# Patient Record
Sex: Female | Born: 1980 | Race: White | Hispanic: No | Marital: Married | Smoking: Never smoker
Health system: Southern US, Community
[De-identification: ages and names within clinical notes are randomized; demographics above are authoritative.]

## PROBLEM LIST (undated history)

## (undated) ENCOUNTER — Inpatient Hospital Stay (HOSPITAL_COMMUNITY): Payer: Self-pay

## (undated) DIAGNOSIS — O24419 Gestational diabetes mellitus in pregnancy, unspecified control: Secondary | ICD-10-CM

## (undated) DIAGNOSIS — IMO0002 Reserved for concepts with insufficient information to code with codable children: Secondary | ICD-10-CM

## (undated) DIAGNOSIS — L409 Psoriasis, unspecified: Secondary | ICD-10-CM

## (undated) DIAGNOSIS — F419 Anxiety disorder, unspecified: Secondary | ICD-10-CM

## (undated) HISTORY — DX: Gestational diabetes mellitus in pregnancy, unspecified control: O24.419

## (undated) HISTORY — DX: Reserved for concepts with insufficient information to code with codable children: IMO0002

## (undated) HISTORY — PX: MOUTH SURGERY: SHX715

## (undated) HISTORY — DX: Anxiety disorder, unspecified: F41.9

## (undated) HISTORY — DX: Psoriasis, unspecified: L40.9

---

## 2010-04-29 ENCOUNTER — Other Ambulatory Visit: Payer: Self-pay | Admitting: Family

## 2010-04-29 ENCOUNTER — Other Ambulatory Visit (HOSPITAL_COMMUNITY)
Admission: RE | Admit: 2010-04-29 | Discharge: 2010-04-29 | Disposition: A | Discharge status: Home / Self Care | Payer: Medicaid Other | Source: Ambulatory Visit | Attending: Family | Admitting: Family

## 2010-04-29 DIAGNOSIS — Z3400 Encounter for supervision of normal first pregnancy, unspecified trimester: Secondary | ICD-10-CM

## 2010-04-29 DIAGNOSIS — Z113 Encounter for screening for infections with a predominantly sexual mode of transmission: Secondary | ICD-10-CM | POA: Insufficient documentation

## 2010-04-29 DIAGNOSIS — Z3480 Encounter for supervision of other normal pregnancy, unspecified trimester: Secondary | ICD-10-CM | POA: Insufficient documentation

## 2010-05-30 DIAGNOSIS — Z3400 Encounter for supervision of normal first pregnancy, unspecified trimester: Secondary | ICD-10-CM

## 2010-06-06 LAB — HIV ANTIBODY (ROUTINE TESTING W REFLEX): HIV: NONREACTIVE

## 2010-06-06 LAB — RPR: RPR: NONREACTIVE

## 2010-06-27 ENCOUNTER — Encounter (INDEPENDENT_AMBULATORY_CARE_PROVIDER_SITE_OTHER): Payer: Medicaid Other

## 2010-06-27 DIAGNOSIS — Z3400 Encounter for supervision of normal first pregnancy, unspecified trimester: Secondary | ICD-10-CM

## 2010-06-28 ENCOUNTER — Other Ambulatory Visit: Payer: Self-pay | Admitting: Obstetrics & Gynecology

## 2010-06-28 DIAGNOSIS — Z3689 Encounter for other specified antenatal screening: Secondary | ICD-10-CM

## 2010-07-04 ENCOUNTER — Encounter (INDEPENDENT_AMBULATORY_CARE_PROVIDER_SITE_OTHER): Payer: Medicaid Other

## 2010-07-04 DIAGNOSIS — R1084 Generalized abdominal pain: Secondary | ICD-10-CM

## 2010-07-04 DIAGNOSIS — F411 Generalized anxiety disorder: Secondary | ICD-10-CM

## 2010-07-04 DIAGNOSIS — Z3480 Encounter for supervision of other normal pregnancy, unspecified trimester: Secondary | ICD-10-CM

## 2010-07-12 ENCOUNTER — Ambulatory Visit (HOSPITAL_COMMUNITY)
Admission: RE | Admit: 2010-07-12 | Discharge: 2010-07-12 | Disposition: A | Discharge status: Home / Self Care | Payer: Medicaid Other | Source: Ambulatory Visit | Attending: Obstetrics & Gynecology | Admitting: Obstetrics & Gynecology

## 2010-07-12 ENCOUNTER — Other Ambulatory Visit: Payer: Self-pay | Admitting: Obstetrics & Gynecology

## 2010-07-12 DIAGNOSIS — Z363 Encounter for antenatal screening for malformations: Secondary | ICD-10-CM | POA: Insufficient documentation

## 2010-07-12 DIAGNOSIS — O358XX0 Maternal care for other (suspected) fetal abnormality and damage, not applicable or unspecified: Secondary | ICD-10-CM | POA: Insufficient documentation

## 2010-07-12 DIAGNOSIS — O2690 Pregnancy related conditions, unspecified, unspecified trimester: Secondary | ICD-10-CM

## 2010-07-12 DIAGNOSIS — Z3689 Encounter for other specified antenatal screening: Secondary | ICD-10-CM

## 2010-07-12 DIAGNOSIS — Z1389 Encounter for screening for other disorder: Secondary | ICD-10-CM | POA: Insufficient documentation

## 2010-07-13 ENCOUNTER — Encounter: Payer: Medicaid Other | Attending: Obstetrics & Gynecology

## 2010-07-13 ENCOUNTER — Ambulatory Visit (HOSPITAL_COMMUNITY)
Admission: RE | Admit: 2010-07-13 | Discharge: 2010-07-13 | Disposition: A | Discharge status: Home / Self Care | Payer: Medicaid Other | Source: Ambulatory Visit | Attending: Obstetrics & Gynecology | Admitting: Obstetrics & Gynecology

## 2010-07-13 ENCOUNTER — Other Ambulatory Visit: Payer: Self-pay | Admitting: Obstetrics & Gynecology

## 2010-07-13 DIAGNOSIS — O2690 Pregnancy related conditions, unspecified, unspecified trimester: Secondary | ICD-10-CM

## 2010-07-13 DIAGNOSIS — Z3689 Encounter for other specified antenatal screening: Secondary | ICD-10-CM | POA: Insufficient documentation

## 2010-07-13 DIAGNOSIS — O99810 Abnormal glucose complicating pregnancy: Secondary | ICD-10-CM | POA: Insufficient documentation

## 2010-07-13 DIAGNOSIS — O358XX0 Maternal care for other (suspected) fetal abnormality and damage, not applicable or unspecified: Secondary | ICD-10-CM | POA: Insufficient documentation

## 2010-07-13 DIAGNOSIS — Z713 Dietary counseling and surveillance: Secondary | ICD-10-CM | POA: Insufficient documentation

## 2010-07-25 ENCOUNTER — Encounter (INDEPENDENT_AMBULATORY_CARE_PROVIDER_SITE_OTHER): Payer: Medicaid Other

## 2010-07-25 DIAGNOSIS — Z3400 Encounter for supervision of normal first pregnancy, unspecified trimester: Secondary | ICD-10-CM

## 2010-07-28 ENCOUNTER — Ambulatory Visit (INDEPENDENT_AMBULATORY_CARE_PROVIDER_SITE_OTHER): Payer: Medicaid Other | Admitting: Psychology

## 2010-07-28 ENCOUNTER — Ambulatory Visit (HOSPITAL_COMMUNITY): Payer: Medicaid Other | Admitting: Psychology

## 2010-07-28 DIAGNOSIS — F411 Generalized anxiety disorder: Secondary | ICD-10-CM

## 2010-08-08 ENCOUNTER — Encounter (HOSPITAL_COMMUNITY): Payer: Medicaid Other | Admitting: Psychology

## 2010-08-10 ENCOUNTER — Other Ambulatory Visit: Payer: Self-pay | Admitting: Obstetrics & Gynecology

## 2010-08-10 ENCOUNTER — Ambulatory Visit (HOSPITAL_COMMUNITY)
Admission: RE | Admit: 2010-08-10 | Discharge: 2010-08-10 | Disposition: A | Discharge status: Home / Self Care | Payer: Medicaid Other | Source: Ambulatory Visit | Attending: Obstetrics & Gynecology | Admitting: Obstetrics & Gynecology

## 2010-08-10 DIAGNOSIS — O358XX0 Maternal care for other (suspected) fetal abnormality and damage, not applicable or unspecified: Secondary | ICD-10-CM | POA: Insufficient documentation

## 2010-08-10 DIAGNOSIS — O2690 Pregnancy related conditions, unspecified, unspecified trimester: Secondary | ICD-10-CM

## 2010-08-15 ENCOUNTER — Encounter (INDEPENDENT_AMBULATORY_CARE_PROVIDER_SITE_OTHER): Payer: Medicaid Other

## 2010-08-15 DIAGNOSIS — Z3480 Encounter for supervision of other normal pregnancy, unspecified trimester: Secondary | ICD-10-CM

## 2010-09-07 ENCOUNTER — Ambulatory Visit (HOSPITAL_COMMUNITY)
Admission: RE | Admit: 2010-09-07 | Discharge: 2010-09-07 | Disposition: A | Discharge status: Home / Self Care | Payer: Medicaid Other | Source: Ambulatory Visit | Attending: Obstetrics & Gynecology | Admitting: Obstetrics & Gynecology

## 2010-09-07 ENCOUNTER — Encounter (HOSPITAL_COMMUNITY): Payer: Self-pay

## 2010-09-07 VITALS — BP 102/60 | HR 93 | Wt 193.0 lb

## 2010-09-07 DIAGNOSIS — O2690 Pregnancy related conditions, unspecified, unspecified trimester: Secondary | ICD-10-CM

## 2010-09-07 DIAGNOSIS — Z3689 Encounter for other specified antenatal screening: Secondary | ICD-10-CM | POA: Insufficient documentation

## 2010-09-07 NOTE — Progress Notes (Signed)
Report in AS/EPIC; follow-up 6 weeks for growth

## 2010-09-12 ENCOUNTER — Encounter (INDEPENDENT_AMBULATORY_CARE_PROVIDER_SITE_OTHER): Payer: Medicaid Other

## 2010-09-12 DIAGNOSIS — Z3400 Encounter for supervision of normal first pregnancy, unspecified trimester: Secondary | ICD-10-CM

## 2010-09-15 ENCOUNTER — Other Ambulatory Visit: Payer: Self-pay | Admitting: Advanced Practice Midwife

## 2010-09-15 ENCOUNTER — Other Ambulatory Visit: Payer: Self-pay | Admitting: Obstetrics & Gynecology

## 2010-09-15 ENCOUNTER — Other Ambulatory Visit (INDEPENDENT_AMBULATORY_CARE_PROVIDER_SITE_OTHER): Payer: Medicaid Other

## 2010-09-15 ENCOUNTER — Telehealth: Payer: Self-pay | Admitting: *Deleted

## 2010-09-15 DIAGNOSIS — O3660X0 Maternal care for excessive fetal growth, unspecified trimester, not applicable or unspecified: Secondary | ICD-10-CM

## 2010-09-15 DIAGNOSIS — O361190 Maternal care for Anti-A sensitization, unspecified trimester, not applicable or unspecified: Secondary | ICD-10-CM

## 2010-09-15 DIAGNOSIS — Z3480 Encounter for supervision of other normal pregnancy, unspecified trimester: Secondary | ICD-10-CM

## 2010-09-15 NOTE — Telephone Encounter (Signed)
Scheduled for AFI 09/29/10 at 10:00 am at [facility].  Pt notified.

## 2010-09-15 NOTE — Telephone Encounter (Signed)
Message copied by Granville Lewis on Thu Sep 15, 2010  1:37 PM ------      Message from: Lesly Dukes      Created: Thu Sep 15, 2010  1:16 PM       Please have this patient seen for Rpt AFI in 2 weeks.  Pts GCT needs to be followed up.

## 2010-09-16 LAB — RPR

## 2010-09-16 LAB — CBC
HCT: 34.2 % — ABNORMAL LOW (ref 36.0–46.0)
Hemoglobin: 10.2 g/dL — ABNORMAL LOW (ref 12.0–15.0)
RDW: 15.9 % — ABNORMAL HIGH (ref 11.5–15.5)
WBC: 8.4 10*3/uL (ref 4.0–10.5)

## 2010-09-16 LAB — HIV ANTIBODY (ROUTINE TESTING W REFLEX): HIV: NONREACTIVE

## 2010-09-16 LAB — RH TYPE: Rh Type: NEGATIVE

## 2010-09-26 ENCOUNTER — Other Ambulatory Visit: Payer: Self-pay | Admitting: Advanced Practice Midwife

## 2010-09-26 ENCOUNTER — Encounter (INDEPENDENT_AMBULATORY_CARE_PROVIDER_SITE_OTHER): Payer: Medicaid Other | Admitting: Advanced Practice Midwife

## 2010-09-26 DIAGNOSIS — Z3480 Encounter for supervision of other normal pregnancy, unspecified trimester: Secondary | ICD-10-CM

## 2010-09-29 ENCOUNTER — Other Ambulatory Visit: Payer: Self-pay | Admitting: Obstetrics & Gynecology

## 2010-09-29 ENCOUNTER — Ambulatory Visit (HOSPITAL_COMMUNITY)
Admission: RE | Admit: 2010-09-29 | Discharge: 2010-09-29 | Disposition: A | Discharge status: Home / Self Care | Payer: Medicaid Other | Source: Ambulatory Visit | Attending: Obstetrics & Gynecology | Admitting: Obstetrics & Gynecology

## 2010-09-29 DIAGNOSIS — O3660X0 Maternal care for excessive fetal growth, unspecified trimester, not applicable or unspecified: Secondary | ICD-10-CM

## 2010-09-29 DIAGNOSIS — O2690 Pregnancy related conditions, unspecified, unspecified trimester: Secondary | ICD-10-CM

## 2010-09-29 DIAGNOSIS — O99810 Abnormal glucose complicating pregnancy: Secondary | ICD-10-CM | POA: Insufficient documentation

## 2010-09-29 DIAGNOSIS — Z3689 Encounter for other specified antenatal screening: Secondary | ICD-10-CM | POA: Insufficient documentation

## 2010-09-29 NOTE — Progress Notes (Signed)
Vital signs reviewed; see ultrasound report 

## 2010-10-03 ENCOUNTER — Encounter (INDEPENDENT_AMBULATORY_CARE_PROVIDER_SITE_OTHER): Payer: Medicaid Other

## 2010-10-03 DIAGNOSIS — Z3480 Encounter for supervision of other normal pregnancy, unspecified trimester: Secondary | ICD-10-CM

## 2010-10-03 DIAGNOSIS — O99810 Abnormal glucose complicating pregnancy: Secondary | ICD-10-CM

## 2010-10-06 ENCOUNTER — Ambulatory Visit (HOSPITAL_COMMUNITY): Admission: RE | Admit: 2010-10-06 | Payer: Medicaid Other | Source: Ambulatory Visit

## 2010-10-06 ENCOUNTER — Other Ambulatory Visit: Payer: Self-pay | Admitting: Obstetrics & Gynecology

## 2010-10-06 ENCOUNTER — Ambulatory Visit (HOSPITAL_COMMUNITY)
Admission: RE | Admit: 2010-10-06 | Discharge: 2010-10-06 | Disposition: A | Discharge status: Home / Self Care | Payer: Medicaid Other | Source: Ambulatory Visit | Attending: Obstetrics & Gynecology | Admitting: Obstetrics & Gynecology

## 2010-10-06 DIAGNOSIS — O4700 False labor before 37 completed weeks of gestation, unspecified trimester: Secondary | ICD-10-CM

## 2010-10-06 DIAGNOSIS — O99810 Abnormal glucose complicating pregnancy: Secondary | ICD-10-CM | POA: Insufficient documentation

## 2010-10-10 ENCOUNTER — Encounter (INDEPENDENT_AMBULATORY_CARE_PROVIDER_SITE_OTHER): Payer: Medicaid Other | Admitting: Physician Assistant

## 2010-10-10 DIAGNOSIS — Z3480 Encounter for supervision of other normal pregnancy, unspecified trimester: Secondary | ICD-10-CM

## 2010-10-10 DIAGNOSIS — F411 Generalized anxiety disorder: Secondary | ICD-10-CM

## 2010-10-10 DIAGNOSIS — O409XX0 Polyhydramnios, unspecified trimester, not applicable or unspecified: Secondary | ICD-10-CM

## 2010-10-13 ENCOUNTER — Ambulatory Visit (HOSPITAL_COMMUNITY)
Admission: RE | Admit: 2010-10-13 | Discharge: 2010-10-13 | Disposition: A | Discharge status: Home / Self Care | Payer: Medicaid Other | Source: Ambulatory Visit | Attending: Obstetrics & Gynecology | Admitting: Obstetrics & Gynecology

## 2010-10-13 DIAGNOSIS — O4700 False labor before 37 completed weeks of gestation, unspecified trimester: Secondary | ICD-10-CM

## 2010-10-13 DIAGNOSIS — O99810 Abnormal glucose complicating pregnancy: Secondary | ICD-10-CM

## 2010-10-13 NOTE — ED Notes (Signed)
FBS 140s this am.  Patient is having increased pressure, contractions this am.  Is scheduled for [place] today as well.  Will discuss with Dr. Sherrie George when reviewing the NST.

## 2010-10-13 NOTE — Progress Notes (Signed)
Vital signs reviewed; see ultrasound report 

## 2010-10-18 ENCOUNTER — Ambulatory Visit (HOSPITAL_COMMUNITY)
Admission: RE | Admit: 2010-10-18 | Discharge: 2010-10-18 | Disposition: A | Discharge status: Home / Self Care | Payer: Medicaid Other | Source: Ambulatory Visit | Attending: Obstetrics & Gynecology | Admitting: Obstetrics & Gynecology

## 2010-10-18 ENCOUNTER — Encounter: Payer: Medicaid Other | Admitting: Obstetrics & Gynecology

## 2010-10-18 ENCOUNTER — Other Ambulatory Visit: Payer: Self-pay | Admitting: Obstetrics & Gynecology

## 2010-10-18 DIAGNOSIS — O288 Other abnormal findings on antenatal screening of mother: Secondary | ICD-10-CM

## 2010-10-18 DIAGNOSIS — O99810 Abnormal glucose complicating pregnancy: Secondary | ICD-10-CM | POA: Insufficient documentation

## 2010-10-19 ENCOUNTER — Ambulatory Visit (HOSPITAL_COMMUNITY): Payer: Medicaid Other

## 2010-10-20 ENCOUNTER — Ambulatory Visit (HOSPITAL_COMMUNITY)
Admission: RE | Admit: 2010-10-20 | Discharge: 2010-10-20 | Disposition: A | Discharge status: Home / Self Care | Payer: Medicaid Other | Source: Ambulatory Visit | Attending: Obstetrics & Gynecology | Admitting: Obstetrics & Gynecology

## 2010-10-20 DIAGNOSIS — O4700 False labor before 37 completed weeks of gestation, unspecified trimester: Secondary | ICD-10-CM

## 2010-10-20 DIAGNOSIS — O99810 Abnormal glucose complicating pregnancy: Secondary | ICD-10-CM

## 2010-10-20 DIAGNOSIS — Z3689 Encounter for other specified antenatal screening: Secondary | ICD-10-CM | POA: Insufficient documentation

## 2010-10-20 NOTE — Progress Notes (Signed)
Ultrasound in AS/OBGYN/EPIC.  Follow up U/S scheduled NST reactive

## 2010-10-24 ENCOUNTER — Ambulatory Visit (INDEPENDENT_AMBULATORY_CARE_PROVIDER_SITE_OTHER): Payer: Medicaid Other | Admitting: Advanced Practice Midwife

## 2010-10-24 DIAGNOSIS — O99810 Abnormal glucose complicating pregnancy: Secondary | ICD-10-CM

## 2010-10-24 DIAGNOSIS — O409XX0 Polyhydramnios, unspecified trimester, not applicable or unspecified: Secondary | ICD-10-CM

## 2010-10-24 DIAGNOSIS — Z3480 Encounter for supervision of other normal pregnancy, unspecified trimester: Secondary | ICD-10-CM

## 2010-10-24 NOTE — Progress Notes (Signed)
Lots of lower pelvic pain and discomfort.

## 2010-10-27 ENCOUNTER — Other Ambulatory Visit (HOSPITAL_COMMUNITY): Payer: Medicaid Other

## 2010-10-27 ENCOUNTER — Ambulatory Visit (HOSPITAL_COMMUNITY): Payer: Medicaid Other

## 2010-10-28 ENCOUNTER — Ambulatory Visit (HOSPITAL_COMMUNITY): Payer: Medicaid Other

## 2010-10-28 ENCOUNTER — Ambulatory Visit (HOSPITAL_COMMUNITY)
Admission: RE | Admit: 2010-10-28 | Discharge: 2010-10-28 | Disposition: A | Discharge status: Home / Self Care | Payer: Medicaid Other | Source: Ambulatory Visit | Attending: Obstetrics & Gynecology | Admitting: Obstetrics & Gynecology

## 2010-10-28 ENCOUNTER — Other Ambulatory Visit: Payer: Self-pay | Admitting: Obstetrics & Gynecology

## 2010-10-28 ENCOUNTER — Other Ambulatory Visit (HOSPITAL_COMMUNITY): Payer: Self-pay | Admitting: Obstetrics and Gynecology

## 2010-10-28 DIAGNOSIS — O99810 Abnormal glucose complicating pregnancy: Secondary | ICD-10-CM

## 2010-10-28 DIAGNOSIS — O2690 Pregnancy related conditions, unspecified, unspecified trimester: Secondary | ICD-10-CM

## 2010-10-28 DIAGNOSIS — O24419 Gestational diabetes mellitus in pregnancy, unspecified control: Secondary | ICD-10-CM

## 2010-10-28 DIAGNOSIS — O4700 False labor before 37 completed weeks of gestation, unspecified trimester: Secondary | ICD-10-CM

## 2010-10-28 DIAGNOSIS — Z3689 Encounter for other specified antenatal screening: Secondary | ICD-10-CM | POA: Insufficient documentation

## 2010-10-28 NOTE — Progress Notes (Signed)
Encounter addended by: Marlana Latus, RN on: 10/28/2010 12:41 PM<BR>     Documentation filed: Charges VN, Flowsheet VN

## 2010-10-28 NOTE — Progress Notes (Signed)
Patient seen for ultrasound and NST appointment today.  Please see AS-OBGYN report for details.

## 2010-10-31 ENCOUNTER — Ambulatory Visit (INDEPENDENT_AMBULATORY_CARE_PROVIDER_SITE_OTHER): Payer: Medicaid Other | Admitting: Advanced Practice Midwife

## 2010-10-31 ENCOUNTER — Other Ambulatory Visit: Payer: Self-pay | Admitting: Advanced Practice Midwife

## 2010-10-31 VITALS — BP 120/79 | Temp 98.6°F | Ht 66.0 in | Wt 203.0 lb

## 2010-10-31 DIAGNOSIS — Z3480 Encounter for supervision of other normal pregnancy, unspecified trimester: Secondary | ICD-10-CM

## 2010-10-31 DIAGNOSIS — O99810 Abnormal glucose complicating pregnancy: Secondary | ICD-10-CM

## 2010-10-31 NOTE — Patient Instructions (Signed)
Pregnancy - Third Trimester The third trimester of pregnancy (the last 3 months) is a period of the most rapid growth for you and your baby. The baby approaches a length of 20 inches and a weight of 6 to 10 pounds. The baby is adding on fat and getting ready for life outside your body. While inside, babies have periods of sleeping and waking, suck their thumbs, and hiccups. You can often feel small contractions of the uterus. This is false labor. It is also called Braxton-Hicks contractions. This is like a practice for labor. The usual problems in this stage of pregnancy include more difficulty breathing, swelling of the hands and feet from water retention, and having to urinate more often because of the uterus and baby pressing on your bladder.  PRENATAL EXAMS  Blood work may continue to be done during prenatal exams. These tests are done to check on your health and the probable health of your baby. Blood work is used to follow your blood levels (hemoglobin). Anemia (low hemoglobin) is common during pregnancy. Iron and vitamins are given to help prevent this. You may also continue to be checked for diabetes. Some of the past blood tests may be done again.   The size of the uterus is measured during each visit. This makes sure your baby is growing properly according to your pregnancy dates.   Your blood pressure is checked every prenatal visit. This is to make sure you are not getting toxemia.   Your urine is checked every prenatal visit for infection, diabetes and protein.   Your weight is checked at each visit. This is done to make sure gains are happening at the suggested rate and that you and your baby are growing normally.   Sometimes, an ultrasound is performed to confirm the position and the proper growth and development of the baby. This is a test done that bounces harmless sound waves off the baby so your caregiver can more accurately determine due dates.   Discuss the type of pain  medication and anesthesia you will have during your labor and delivery.   Discuss the possibility and anesthesia if a Cesarean Section might be necessary.   Inform your caregiver if there is any mental or physical violence at home.  Sometimes, a specialized non-stress test, contraction stress test and biophysical profile are done to make sure the baby is not having a problem. Checking the amniotic fluid surrounding the baby is called an amniocentesis. The amniotic fluid is removed by sticking a needle into the belly (abdomen). This is sometimes done near the end of pregnancy if an early delivery is required. In this case, it is done to help make sure the baby's lungs are mature enough for the baby to live outside of the womb. If the lungs are not mature and it is unsafe to deliver the baby, an injection of cortisone medication is given to the mother 1 to 2 days before the delivery. This helps the baby's lungs mature and makes it safer to deliver the baby. CHANGES OCCURING IN THE THIRD TRIMESTER OF PREGNANCY Your body goes through many changes during pregnancy. They vary from person to person. Talk to your caregiver about changes you notice and are concerned about.  During the last trimester, you have probably had an increase in your appetite. It is normal to have cravings for certain foods. This varies from person to person and pregnancy to pregnancy.   You may begin to get stretch marks on your hips,   abdomen, and breasts. These are normal changes in the body during pregnancy. There are no exercises or medications to take which prevent this change.   Constipation may be treated with a stool softener or adding bulk to your diet. Drinking lots of fluids, fiber in vegetables, fruits, and whole grains are helpful.   Exercising is also helpful. If you have been very active up until your pregnancy, most of these activities can be continued during your pregnancy. If you have been less active, it is helpful  to start an exercise program such as walking. Consult your caregiver before starting exercise programs.   Avoid all smoking, alcohol, un-prescribed drugs, herbs and "street drugs" during your pregnancy. These chemicals affect the formation and growth of the baby. Avoid chemicals throughout the pregnancy to ensure the delivery of a healthy infant.   Backache, varicose veins and hemorrhoids may develop or get worse.   You will tire more easily in the third trimester, which is normal.   The baby's movements may be stronger and more often.   You may become short of breath easily.   Your belly button may stick out.   A yellow discharge may leak from your breasts called colostrum.   You may have a bloody mucus discharge. This usually occurs a few days to a week before labor begins.  HOME CARE INSTRUCTIONS  Keep your caregiver's appointments. Follow your caregiver's instructions regarding medication use, exercise, and diet.   During pregnancy, you are providing food for you and your baby. Continue to eat regular, well-balanced meals. Choose foods such as meat, fish, milk and other low fat dairy products, vegetables, fruits, and whole-grain breads and cereals. Your caregiver will tell you of the ideal weight gain.   A physical sexual relationship may be continued throughout pregnancy if there are no other problems such as early (premature) leaking of amniotic fluid from the membranes, vaginal bleeding, or belly (abdominal) pain.   Exercise regularly if there are no restrictions. Check with your caregiver if you are unsure of the safety of your exercises. Greater weight gain will occur in the last 2 trimesters of pregnancy. Exercising helps:   Control your weight.   Get you in shape for labor and delivery.   You lose weight after you deliver.   Rest a lot with legs elevated, or as needed for leg cramps or low back pain.   Wear a good support or jogging bra for breast tenderness during  pregnancy. This may help if worn during sleep. Pads or tissues may be used in the bra if you are leaking colostrum.   Do not use hot tubs, steam rooms, or saunas.   Wear your seat belt when driving. This protects you and your baby if you are in an accident.   Avoid raw meat, cat litter boxes and soil used by cats. These carry germs that can cause birth defects in the baby.   It is easier to loose urine during pregnancy. Tightening up and strengthening the pelvic muscles will help with this problem. You can practice stopping your urination while you are going to the bathroom. These are the same muscles you need to strengthen. It is also the muscles you would use if you were trying to stop from passing gas. You can practice tightening these muscles up 10 times a set and repeating this about 3 times per day. Once you know what muscles to tighten up, do not perform these exercises during urination. It is more likely to   cause an infection by backing up the urine.   Ask for help if you have financial, counseling or nutritional needs during pregnancy. Your caregiver will be able to offer counseling for these needs as well as refer you for other special needs.   Make a list of emergency phone numbers and have them available.   Plan on getting help from family or friends when you go home from the hospital.   Make a trial run to the hospital.   Take prenatal classes with the father to understand, practice and ask questions about the labor and delivery.   Prepare the baby's room/nursery.   Do not travel out of the city unless it is absolutely necessary and with the advice of your caregiver.   Wear only low or no heal shoes to have better balance and prevent falling.  MEDICATIONS AND DRUG USE IN PREGNANCY  Take prenatal vitamins as directed. The vitamin should contain 1 milligram of folic acid. Keep all vitamins out of reach of children. Only a couple vitamins or tablets containing iron may be fatal  to a baby or young child when ingested.   Avoid use of all medications, including herbs, over-the-counter medications, not prescribed or suggested by your caregiver. Only take over-the-counter or prescription medicines for pain, discomfort, or fever as directed by your caregiver. Do not use aspirin, ibuprofen (Motrin, Advil, Nuprin) or naproxen (Aleve) unless OK'd by your caregiver.   Let your caregiver also know about herbs you may be using.   Alcohol is related to a number of birth defects. This includes fetal alcohol syndrome. All alcohol, in any form, should be avoided completely. Smoking will cause low birth rate and premature babies.   Street/illegal drugs are very harmful to the baby. They are absolutely forbidden. A baby born to an addicted mother will be addicted at birth. The baby will go through the same withdrawal an adult does.  SEEK MEDICAL CARE IF  You have any concerns or worries during your pregnancy. It is better to call with your questions if you feel they cannot wait, rather than worry about them.  DECISIONS ABOUT CIRCUMCISION You may or may not know the sex of your baby. If you know your baby is a boy, it may be time to think about circumcision. Circumcision is the removal of the foreskin of the penis. This is the skin that covers the sensitive end of the penis. There is no proven medical need for this. Often this decision is made on what is popular at the time or based upon religious beliefs and social issues. You can discuss these issues with your caregiver or pediatrician. SEEK IMMEDIATE MEDICAL CARE IF:  An unexplained oral temperature above 101.0 develops, or as your caregiver suggests.   You have leaking of fluid from the vagina (birth canal). If leaking membranes are suspected, take your temperature and tell your caregiver of this when you call.   There is vaginal spotting, bleeding or passing clots. Tell your caregiver of the amount and how many pads are used.    You develop a bad smelling vaginal discharge with a change in the color from clear to white.   You develop vomiting that lasts more than 24 hours.   You develop chills or fever.   You develop shortness of breath.   You develop burning on urination.   You loose more than 2 pounds of weight or gain more than 2 pounds of weight or as suggested by your caregiver.     You notice sudden swelling of your face, hands, and feet or legs.   You develop belly (abdominal) pain. Round ligament discomfort is a common non-cancerous (benign) cause of abdominal pain in pregnancy. Your caregiver still must evaluate you.   You develop a severe headache that does not go away.   You develop visual problems, blurred or double vision.   If you have not felt your baby move for more than 1 hour. If you think the baby is not moving as much as usual, eat something with sugar in it and lie down on your left side for an hour. The baby should move at least 4 to 5 times per hour. Call right away if your baby moves less than that.   You fall, are in a car accident or any kind of trauma.   There is mental or physical violence at home.  Document Released: 01/24/2001 Document Re-Released: 11/27/2008 [employer] Patient Information 2011 [employer]. 

## 2010-10-31 NOTE — Progress Notes (Signed)
Reactive NST. GC/CT and GBS done today. SVE: 1/thick/high. U/S on 9/14 at [facility]: AFI 36, EFW 7# (89%ile), AC 97%ile, footling breech at that time. Cephalic presentation today on u/s. Has appt with [facility]FM for BPP and AFI on Thursday. Rev'd precautions.

## 2010-10-31 NOTE — Progress Notes (Signed)
p=106 

## 2010-10-31 NOTE — Progress Notes (Signed)
Addended by: Granville Lewis on: 10/31/2010 05:01 PM   Modules accepted: Orders

## 2010-11-02 LAB — GC/CHLAMYDIA PROBE AMP, GENITAL: Chlamydia, DNA Probe: NEGATIVE

## 2010-11-03 ENCOUNTER — Ambulatory Visit (HOSPITAL_COMMUNITY): Payer: Medicaid Other

## 2010-11-03 ENCOUNTER — Encounter: Payer: Self-pay | Admitting: *Deleted

## 2010-11-03 ENCOUNTER — Ambulatory Visit (HOSPITAL_COMMUNITY)
Admission: RE | Admit: 2010-11-03 | Discharge: 2010-11-03 | Disposition: A | Discharge status: Home / Self Care | Payer: Medicaid Other | Source: Ambulatory Visit | Attending: Obstetrics & Gynecology | Admitting: Obstetrics & Gynecology

## 2010-11-03 DIAGNOSIS — O409XX0 Polyhydramnios, unspecified trimester, not applicable or unspecified: Secondary | ICD-10-CM | POA: Insufficient documentation

## 2010-11-03 DIAGNOSIS — O99810 Abnormal glucose complicating pregnancy: Secondary | ICD-10-CM | POA: Insufficient documentation

## 2010-11-03 DIAGNOSIS — O2690 Pregnancy related conditions, unspecified, unspecified trimester: Secondary | ICD-10-CM

## 2010-11-03 DIAGNOSIS — O4700 False labor before 37 completed weeks of gestation, unspecified trimester: Secondary | ICD-10-CM

## 2010-11-03 NOTE — Progress Notes (Signed)
Encounter addended by: Marlana Latus, RN on: 11/03/2010  2:15 PM<BR>     Documentation filed: Episodes

## 2010-11-04 ENCOUNTER — Other Ambulatory Visit: Payer: Self-pay | Admitting: Obstetrics & Gynecology

## 2010-11-04 DIAGNOSIS — O409XX0 Polyhydramnios, unspecified trimester, not applicable or unspecified: Secondary | ICD-10-CM

## 2010-11-04 DIAGNOSIS — O99810 Abnormal glucose complicating pregnancy: Secondary | ICD-10-CM

## 2010-11-07 ENCOUNTER — Ambulatory Visit (INDEPENDENT_AMBULATORY_CARE_PROVIDER_SITE_OTHER): Payer: Medicaid Other | Admitting: Physician Assistant

## 2010-11-07 DIAGNOSIS — Z3480 Encounter for supervision of other normal pregnancy, unspecified trimester: Secondary | ICD-10-CM

## 2010-11-07 DIAGNOSIS — O99810 Abnormal glucose complicating pregnancy: Secondary | ICD-10-CM

## 2010-11-07 DIAGNOSIS — O409XX0 Polyhydramnios, unspecified trimester, not applicable or unspecified: Secondary | ICD-10-CM

## 2010-11-07 NOTE — Progress Notes (Signed)
p-93 

## 2010-11-07 NOTE — Progress Notes (Signed)
IOL scheduled 11/22/10.

## 2010-11-10 ENCOUNTER — Ambulatory Visit (HOSPITAL_COMMUNITY)
Admission: RE | Admit: 2010-11-10 | Discharge: 2010-11-10 | Disposition: A | Discharge status: Home / Self Care | Payer: Medicaid Other | Source: Ambulatory Visit | Attending: Obstetrics & Gynecology | Admitting: Obstetrics & Gynecology

## 2010-11-10 DIAGNOSIS — O99810 Abnormal glucose complicating pregnancy: Secondary | ICD-10-CM | POA: Insufficient documentation

## 2010-11-10 DIAGNOSIS — O409XX0 Polyhydramnios, unspecified trimester, not applicable or unspecified: Secondary | ICD-10-CM | POA: Insufficient documentation

## 2010-11-10 NOTE — Progress Notes (Signed)
Patient seen for ultrasound appointment today.  Please see AS-OBGYN report for details.  

## 2010-11-14 ENCOUNTER — Ambulatory Visit (INDEPENDENT_AMBULATORY_CARE_PROVIDER_SITE_OTHER): Payer: Medicaid Other | Admitting: Physician Assistant

## 2010-11-14 VITALS — BP 117/75 | Temp 98.5°F | Wt 208.0 lb

## 2010-11-14 DIAGNOSIS — O99810 Abnormal glucose complicating pregnancy: Secondary | ICD-10-CM

## 2010-11-14 DIAGNOSIS — Z3480 Encounter for supervision of other normal pregnancy, unspecified trimester: Secondary | ICD-10-CM

## 2010-11-14 DIAGNOSIS — O24419 Gestational diabetes mellitus in pregnancy, unspecified control: Secondary | ICD-10-CM

## 2010-11-14 MED ORDER — GLYBURIDE 2.5 MG PO TABS: 2.5000 mg | ORAL_TABLET | Freq: Every day | ORAL | Status: DC

## 2010-11-14 NOTE — Patient Instructions (Signed)
Natural Childbirth Natural childbirth is going through labor and delivery without any pain medication or having anesthesia (epidural or spinal). You also do not use fetal monitors, get a Cesarean Section (an incision in your lower stomach) or episiotomy (a cut on the outside of the lower vagina). With the help of a birthing professional (midwife), you will direct your own labor and delivery as you choose. Many women chose natural childbirth because they feel more in control and in touch with their labor and delivery. They are also concerned about the medications affecting themselves and the baby. Pregnant women with a high risk pregnancy should not attempt natural childbirth. This is because of the risks to themselves and the baby. It is better to deliver their baby in a hospital if an emergency situation arises. The caregiver has to intervene for the health and safety of the mother and baby. Pain during labor is the result of the cervix dilating and the uterus contracting to push the baby out through the vagina. Pain is also caused by stress, anxiety and the muscles of your body being tense, unrelaxed and out of shape. TWO TECHNIQUES FOR NATURAL CHILDBIRTH:   The Lamaze method teaches women that having a baby is normal, healthy and natural. It also teaches the mother to take a neutral position regarding pain medication and anesthesia and to make an informed decision if and when it is right for them.   The Erven Colla (also called Husband Coached Birth) teaches the father to be the birth coach and stresses a natural approach. It also encourages exercise and a balanced diet with good nutrition. The exercises teach relaxation and deep breathing techniques. However, there are also classes to prepare the parents for an emergency situation that may occur.  METHODS OF DEALING WITH LABOR PAIN AND DELIVERY:  Meditation.  Yoga.   Hypnosis.   Acupuncture.   Massage.   Changing positions (walking,  rocking, showering, leaning on birth balls).  Lying in warm water or a jacuzzi.   Get yourself some type of an activity that keeps your mind off of the labor pain.  Listen to soft music.   Visual imagery (focus on a particular object).   BEFORE GOING INTO LABOR  Be sure you and your spouse/partner are in agreement to have natural childbirth.   Decide if your caregiver or a midwife will deliver your baby.   Decide if you will have your baby in the hospital, birthing center or at home.   If you have children, make plans to have someone to take care of them when you go to the hospital.   Know the distance and the time it takes to go to the delivery center. Make a dry run to be sure.   Have a bag packed with a night gown, bathrobe and toiletries ready to take when you go into labor.   Keep phone numbers of your family and friends handy if you need to call someone when you go into labor.   Your spouse/partner should go to all the teaching classes.   Talk with your caregiver the possibility of medical emergency and what will happen if that occurs.  ADVANTAGES OF NATURAL CHILDBIRTH  You are in control of your labor and delivery.   It is safe.   There are no medications or anesthetics that may affect you and the baby.   There are no invasive procedures such as an episiotomy.   You and your partner will work together and that increases  your bond.   Meditation, yoga, massage and breathing exercise can be learned while pregnant and help you when you are in labor and at delivery.   In most delivery centers, the family and friends can be involved in the labor and delivery process.  DISADVANTAGES OF NATURAL CHILDBIRTH  You will experience pain during your labor and delivery.   The methods (stated above) of helping relieve your labor pains may not work for you.   You may feel embarrassed, disappointed and a failure if you decide to change your mind during labor and not have natural  childbirth.  AFTER THE DELIVERY  You will be very tired.   You will be uncomfortable because of your uterus contracting. You will feel soreness around the vagina.   You may feel cold and shaky, this is a natural reaction.   You will be excited, overwhelmed, accomplished and proud to be a mother.  HOME CARE INSTRUCTIONS  Follow the advice and instructions of your caregiver.   Follow the instructions of your natural childbirth instructor (Lamaze or Bradley Method).  Document Released: 01/13/2008  [facility] Patient Information 2011 [place], [place].Gestational Diabetes Mellitus (GDM) Gestational diabetes mellitus (GDM) is diabetes that occurs only during pregnancy. This happens when the body cannot properly handle the glucose (sugar) that increases in the blood after eating. During pregnancy, insulin resistance (reduced sensitivity to insulin) occurs because of the release of hormones from the placenta. Usually, the pancreas of pregnant women produces enough insulin to overcome the resistance that occurs. However, in gestational diabetes, the insulin is there but it does not work effectively. If the resistance is severe enough that the pancreas does not produce enough insulin, extra glucose builds up in the blood.  WHO IS AT RISK FOR DEVELOPING GESTATIONAL DIABETES?  Women with a history of diabetes in the family.   Women over age 41.   Women who are overweight.   Women in certain ethnic groups (Hispanic, African American, Native American, [place] and [place] Islander).  WHAT CAN HAPPEN TO THE BABY? If the mother's blood glucose is too high while she is pregnant, the extra sugar will travel through the umbilical cord to the baby. Some of the problems the baby may have are:  Large Baby - If the baby receives too much sugar, the baby will gain more weight. This may cause the baby to be too large to be born normally (vaginally) and a Cesarean section (C-section) may be needed.   Low Blood  Glucose (hypoglycemia) - The baby makes extra insulin, in response to the extra sugar its gets from its mother. When the baby is born and no longer needs this extra insulin, the baby's blood glucose level may drop.   Jaundice (yellow coloring of the skin and eyes) - This is fairly common in babies. It is caused from a build-up of the chemical called bilirubin. This is rarely serious, but is seen more often in babies whose mothers had gestational diabetes.  RISKS TO THE MOTHER Women who have had gestational diabetes may be at higher risk for some problems, including:  Preeclampsia or toxemia, which includes problems with high blood pressure. Blood pressure and protein levels in the urine must be checked frequently.   Infections.   Cesarean section (C-section) for delivery.   Developing Type 2 diabetes later in life. About 30-50% will develop diabetes later, especially if obese.  DIAGNOSIS The hormones that cause insulin resistance are highest at about 24-28 weeks of pregnancy. If symptoms are experienced, they  are much like symptoms you would normally expect during pregnancy.  GDM is often diagnosed using a two part method: 1. After 24-28 weeks of pregnancy, the woman drinks a glucose solution and takes a blood test. If the glucose level is high, a second test will be given.  2. Oral Glucose Tolerance Test (OGTT) which is 3 hours long - After not eating overnight, the blood glucose is checked. The woman drinks a glucose solution, and hourly blood glucose tests are taken.  If the woman has risk factors for GDM, the caregiver may test earlier than 24 weeks of pregnancy. TREATMENT Treatment of GDM is directed at keeping the mother's blood glucose level normal, and may include:  Meal planning.   Taking insulin or other medicine to control your blood glucose level.   Exercise.   Keeping a daily record of the foods you eat.   Blood glucose monitoring and keeping a record of your blood glucose  levels.   May monitor ketone levels in the urine, although this is no longer considered necessary in most pregnancies.  HOME CARE INSTRUCTIONS While you are pregnant:  Follow your caregiver's advice regarding your prenatal appointments, meal planning, exercise, medicines, vitamins, blood and other tests, and physical activities.   Keep a record of your meals, blood glucose tests, and the amount of insulin you are taking (if any). Show this to your caregiver at every prenatal visit.   If you have GDM, you may have problems with hypoglycemia (low blood glucose). You may suspect this if you become suddenly dizzy, feel shaky, and/or weak. If you think this is happening and you have a glucose meter, try to test your blood glucose level. Follow your caregiver's advice for when and how to treat your low blood glucose. Generally, the 15:15 rule is followed: Treat by consuming 15 grams of carbohydrates, wait 15 minutes, and recheck blood glucose. Examples of 15 grams of carbohydrates are:   1 cup skim or low fat milk.    cup juice.   3-4 glucose tablets.   5-6 hard candies.   1 small box raisins.    cup regular soda pop.   Practice good hygiene, to avoid infections.   Do not smoke.  SEEK MEDICAL CARE IF:  You develop abnormal vaginal discharge, with or without itching.   You become weak and tired more than expected.   You seem to sweat a lot.   You have a sudden increase in weight, 5 pounds or more in one week.   You are losing weight, 3 pounds or more in a week.   Your blood glucose level is high, and you need instructions on what to do about it.  SEEK IMMEDIATE MEDICAL CARE IF:  You develop a severe headache.   You faint or pass out.   You develop nausea and vomiting.   You become disoriented or confused.   You have a convulsion.   You develop vision problems.   You develop stomach pain.   You develop vaginal bleeding.   You develop uterine contractions.   You  have leaking or a gush of fluid from the vagina.  AFTER YOU HAVE THE BABY:  Go to all of your follow-up appointments, and have blood tests as advised by your caregiver.   Maintain a healthy lifestyle, to prevent diabetes in the future. This includes:   Following a healthy meal plan.   Controlling your weight.   Getting enough exercise and proper rest.   Do not smoke.  Breastfeed your baby if you can. This will lower the chance of you and your baby developing diabetes later in life.  For more information about diabetes, go to the American Diabetes Association at: PMFashions.com.cy. For more information about gestational diabetes, go to the [employer] at: RentRule.com.au. Document Released: 05/08/2000 Document Re-Released: 01/18/2009 [facility] Patient Information 2011 [place], [place].

## 2010-11-14 NOTE — Progress Notes (Signed)
p-88  Pt states that she had contractions lasting about 2 min x 1 hour on Sat night but when she laid down they were gone.

## 2010-11-14 NOTE — Progress Notes (Signed)
Not checking BS regularly, last check 2 weeks ago. States hard to check due to psorisis. ~ 1 hour pp today 137. Will check fasting with NST on Thursday. Cont glyburide as Rx'd. Strongly encouraged to chk BS as instructed 4 x daily.

## 2010-11-15 ENCOUNTER — Other Ambulatory Visit: Payer: Self-pay | Admitting: Advanced Practice Midwife

## 2010-11-17 ENCOUNTER — Ambulatory Visit (HOSPITAL_COMMUNITY)
Admission: RE | Admit: 2010-11-17 | Discharge: 2010-11-17 | Disposition: A | Discharge status: Home / Self Care | Payer: Medicaid Other | Source: Ambulatory Visit | Attending: Obstetrics & Gynecology | Admitting: Obstetrics & Gynecology

## 2010-11-17 DIAGNOSIS — O409XX0 Polyhydramnios, unspecified trimester, not applicable or unspecified: Secondary | ICD-10-CM

## 2010-11-17 DIAGNOSIS — O99810 Abnormal glucose complicating pregnancy: Secondary | ICD-10-CM

## 2010-11-18 ENCOUNTER — Telehealth (HOSPITAL_COMMUNITY): Payer: Self-pay | Admitting: *Deleted

## 2010-11-18 ENCOUNTER — Encounter (HOSPITAL_COMMUNITY): Payer: Self-pay | Admitting: *Deleted

## 2010-11-18 ENCOUNTER — Ambulatory Visit (INDEPENDENT_AMBULATORY_CARE_PROVIDER_SITE_OTHER): Payer: Medicaid Other | Admitting: Family

## 2010-11-18 DIAGNOSIS — O99810 Abnormal glucose complicating pregnancy: Secondary | ICD-10-CM

## 2010-11-18 DIAGNOSIS — Z3480 Encounter for supervision of other normal pregnancy, unspecified trimester: Secondary | ICD-10-CM

## 2010-11-18 DIAGNOSIS — O479 False labor, unspecified: Secondary | ICD-10-CM

## 2010-11-18 NOTE — Progress Notes (Signed)
Pt having contractions and lower back pain.  P-90  Pt feels like she may be having a slow amniotic leak.

## 2010-11-18 NOTE — Progress Notes (Signed)
Pt here with report of contractions that started around 1900 last night; the frequency has decreased today; also reports feeling slow leak of fluid; denies sexual intercourse.  Exam - negative pooling, negative ferns.  Reviewed labor precautions.  Next appt Mon with induction on Tuesday.

## 2010-11-18 NOTE — Telephone Encounter (Signed)
UPDATE RISK FACTORS

## 2010-11-18 NOTE — Telephone Encounter (Signed)
Preadmission screen  

## 2010-11-21 ENCOUNTER — Ambulatory Visit (INDEPENDENT_AMBULATORY_CARE_PROVIDER_SITE_OTHER): Payer: Medicaid Other | Admitting: Advanced Practice Midwife

## 2010-11-21 VITALS — BP 136/80 | Temp 97.4°F | Wt 211.0 lb

## 2010-11-21 DIAGNOSIS — O24419 Gestational diabetes mellitus in pregnancy, unspecified control: Secondary | ICD-10-CM

## 2010-11-21 DIAGNOSIS — O99810 Abnormal glucose complicating pregnancy: Secondary | ICD-10-CM

## 2010-11-21 DIAGNOSIS — Z3480 Encounter for supervision of other normal pregnancy, unspecified trimester: Secondary | ICD-10-CM

## 2010-11-21 NOTE — Progress Notes (Signed)
p-96  Pt thinks she may have lost mucus plug this weekend.

## 2010-11-21 NOTE — Progress Notes (Signed)
Reactive NST. Baby very active. For IOL in AM. Pt not checking CBGs due to psoriasis issues. CBG today 201. Lengthy discussion about needle phobia, C/S and anesthesia.

## 2010-11-21 NOTE — Patient Instructions (Signed)
Labor Induction A pregnant woman usually goes into labor spontaneously before the birth of her baby. Most babies are born between 57 and 42 weeks of the pregnancy. When this does not happen, caregivers may use medication or other methods to bring on (induce) labor. Labor induction causes a pregnant woman's uterus to contract, the cervix to open (dilate) and thin out (efface) to prepare for the vaginal birth of her baby. Several methods of labor induction may be used such as:  Massaging the nipple and areola of the breasts (nipple stimulation).   Prostaglandin medication used orally or as a vaginal cream.   Striping of membranes (your caregiver inserts a finger between the cervix and membranes around the baby's head) causes the body to produce prostaglandins that soften the cervix and cause the uterus to contract.   Rupture of the water bag (amniotomy).   Oxytocin by IV.   Special dilators placed into the cervical canal that causes the cervix to soften and open.   Mechanical devices to stretch open the cervix such as, a dilated foley catheter.  Whether your labor will be induced depends on the condition of you and your baby, how far along you are, are the baby's lung maturity, the condition of the cervix, the way the baby is lying, and other factors. Usually, labor is not induced before 39 weeks of the pregnancy unless there is a problem with the baby or mother, and it becomes necessary to induce labor. REASONS LABOR SHOULD BE INDUCED:  The health of the baby or mother has become at risk.   The pregnancy is overdue by 2 weeks or more.   Your water breaks (premature rupture of membranes), the baby's lungs are mature, and labor does not start on its own.   You develop high blood pressure (toxemia of pregnancy).   You develop an infection in your uterus.   You have diabetes or other serious medical illness.   Amniotic fluid amounts are small around the baby.   Your placenta begins to  separate from the inner wall of the uterus before the baby is born (placental abruption). This condition may cause you to have an emergency Cesarean delivery.   You have fetal death.   A social induction is also known as an induction for convenience. Most of the time, labor is induced for sound medical reasons. Sometimes, it is done as a Agricultural engineer. Living a long way from the hospital or having a history of very rapid labors may be reasons the mother may want to induce delivery.  REASONS LABOR SHOULD NOT BE INDUCED:  You have had previous surgeries on your uterus. This is especially true if the surgeries went into the inside lining and cavity of the uterus. This gives an added risk for rupturing the uterus.   You have placenta previa. This means your placenta lies very low in the uterus and blocks the opening (cervix) for the baby to get out.   Your baby is not in a head down position. For example, if your baby lies across your uterus (transverse) instead of head first.   If the umbilical cord drops down into the birth canal in front of your baby. This could cut off the baby's blood supply and oxygen to the baby.  RISKS & COMPLICATIONS OF THE PROCEDURE Problems seldom occur with labor induction, but there can be some complications. Some of the risks of induction include:  Change in fetal heart rate (too high, too low or irradic).  Increased risk of a premature baby, even if you think your baby is term.   Increased risk of fetal distress. This means your baby gets into problems during induction. This can be caused by the umbilical cord coming out in front of the baby or is being squeezed.   Increased risk of infection to mother and baby.   Increased chance of having a Cesarean delivery. This is an operation on your belly (abdomen) to remove the baby.   Strong contractions can lead to abruption. This is a separating of the placenta from the uterus.   Uterine rupture, especially if you  had a previous Cesarean or surgery on your uterus.  When labor is induced because of medical problems, other risks may be present. Induced labor may lead to:  Increased use of medications for pain relief.   Continuous fetal monitoring.   Other interventions.  When induction is needed for medical reasons, the benefits of induction may outweigh the risks. INDUCTION PROCEDURE It can sometimes take up to 2 or 3 days to induce labor. It usually takes less time. It takes longer when you are induced early in the pregnancy and for first pregnancies.  Before coming to the hospital for an induction:  Do not eat much before you come to the hospital (for at least 8 hours).   Do not eat after midnight if you are going to be induced the next morning.   Be aware that medications for labor induction can upset your stomach.   Let your caregiver know if you need medications for pain.  HOME CARE INSTRUCTIONS If you have been induced in your caregiver's office to start labor, and are allowed to go home, follow the instructions given to you by your care giver. SEEK IMMEDIATE MEDICAL CARE IF:  You develop any kind of vaginal bleeding.   You develop contractions that are severe and continuous.   You feel faint or feel light headed.   You do not develop contractions within the time your caregiver suggests you should.   You begin to run a temperature of 100 F (37.8 C) or develop chills.   You no longer feel the normal fetal movement.  Document Released: 06/21/2006 Document Re-Released: 11/26/2008 [facility] Patient Information 2011 [place], [place].

## 2010-11-22 ENCOUNTER — Inpatient Hospital Stay (HOSPITAL_COMMUNITY)
Admission: RE | Admit: 2010-11-22 | Discharge: 2010-11-26 | DRG: 765 | Disposition: A | Discharge status: Home / Self Care | Payer: Medicaid Other | Source: Ambulatory Visit | Attending: Obstetrics & Gynecology | Admitting: Obstetrics & Gynecology

## 2010-11-22 ENCOUNTER — Encounter (HOSPITAL_COMMUNITY): Payer: Self-pay

## 2010-11-22 DIAGNOSIS — O99810 Abnormal glucose complicating pregnancy: Secondary | ICD-10-CM

## 2010-11-22 DIAGNOSIS — O409XX0 Polyhydramnios, unspecified trimester, not applicable or unspecified: Secondary | ICD-10-CM

## 2010-11-22 DIAGNOSIS — O99814 Abnormal glucose complicating childbirth: Principal | ICD-10-CM | POA: Diagnosis present

## 2010-11-22 DIAGNOSIS — E860 Dehydration: Secondary | ICD-10-CM

## 2010-11-22 LAB — HEPATITIS B SURFACE ANTIGEN: Hepatitis B Surface Ag: NEGATIVE

## 2010-11-22 LAB — CBC
Hemoglobin: 10.8 g/dL — ABNORMAL LOW (ref 12.0–15.0)
MCH: 29.8 pg (ref 26.0–34.0)
MCHC: 32.1 g/dL (ref 30.0–36.0)
Platelets: 285 10*3/uL (ref 150–400)
RDW: 15.5 % (ref 11.5–15.5)

## 2010-11-22 LAB — RPR
RPR Ser Ql: NONREACTIVE
RPR: NONREACTIVE

## 2010-11-22 MED ORDER — MISOPROSTOL 25 MCG QUARTER TABLET
25.0000 ug | ORAL_TABLET | ORAL | Status: DC
  Administered 2010-11-22 (×2): 25 ug via VAGINAL
  Filled 2010-11-22 (×3): qty 1
  Filled 2010-11-22 (×3): qty 0.25
  Filled 2010-11-22: qty 1
  Filled 2010-11-22: qty 0.25

## 2010-11-22 MED ORDER — LIDOCAINE HCL (PF) 1 % IJ SOLN
INTRAMUSCULAR | Status: AC
  Filled 2010-11-22: qty 30

## 2010-11-22 MED ORDER — ACETAMINOPHEN 325 MG PO TABS
650.0000 mg | ORAL_TABLET | ORAL | Status: DC | PRN
  Administered 2010-11-22: 650 mg via ORAL
  Filled 2010-11-22 (×2): qty 1

## 2010-11-22 MED ORDER — OXYTOCIN BOLUS FROM INFUSION
500.0000 mL | Freq: Once | INTRAVENOUS | Status: DC
  Filled 2010-11-22: qty 500

## 2010-11-22 MED ORDER — GLYBURIDE 2.5 MG PO TABS
3.7500 mg | ORAL_TABLET | Freq: Every day | ORAL | Status: DC
  Administered 2010-11-22: 3.75 mg via ORAL
  Filled 2010-11-22 (×2): qty 1

## 2010-11-22 MED ORDER — LACTATED RINGERS IV SOLN
500.0000 mL | INTRAVENOUS | Status: DC | PRN
  Administered 2010-11-23: 500 mL via INTRAVENOUS

## 2010-11-22 MED ORDER — NALBUPHINE SYRINGE 5 MG/0.5 ML
10.0000 mg | INJECTION | INTRAMUSCULAR | Status: DC | PRN
  Administered 2010-11-23 (×2): 10 mg via INTRAVENOUS
  Filled 2010-11-22 (×2): qty 1

## 2010-11-22 MED ORDER — CITRIC ACID-SODIUM CITRATE 334-500 MG/5ML PO SOLN
30.0000 mL | ORAL | Status: DC | PRN
  Administered 2010-11-23: 30 mL via ORAL
  Filled 2010-11-22: qty 15

## 2010-11-22 MED ORDER — IBUPROFEN 600 MG PO TABS: 600.0000 mg | ORAL_TABLET | Freq: Four times a day (QID) | ORAL | Status: DC | PRN

## 2010-11-22 MED ORDER — ONDANSETRON HCL 4 MG/2ML IJ SOLN: 4.0000 mg | Freq: Four times a day (QID) | INTRAMUSCULAR | Status: DC | PRN

## 2010-11-22 MED ORDER — FLEET ENEMA 7-19 GM/118ML RE ENEM: 1.0000 | ENEMA | RECTAL | Status: DC | PRN

## 2010-11-22 MED ORDER — SERTRALINE HCL 100 MG PO TABS
100.0000 mg | ORAL_TABLET | Freq: Every day | ORAL | Status: DC
  Administered 2010-11-22: 100 mg via ORAL
  Filled 2010-11-22 (×2): qty 1

## 2010-11-22 MED ORDER — LIDOCAINE HCL (PF) 1 % IJ SOLN
30.0000 mL | INTRAMUSCULAR | Status: AC | PRN
  Administered 2010-11-22: 30 mL via SUBCUTANEOUS

## 2010-11-22 MED ORDER — OXYCODONE-ACETAMINOPHEN 5-325 MG PO TABS: 2.0000 | ORAL_TABLET | ORAL | Status: DC | PRN

## 2010-11-22 MED ORDER — LORAZEPAM 1 MG PO TABS
1.0000 mg | ORAL_TABLET | Freq: Once | ORAL | Status: AC
  Administered 2010-11-22: 1 mg via ORAL
  Filled 2010-11-22: qty 1

## 2010-11-22 MED ORDER — OXYTOCIN 20 UNITS IN LACTATED RINGERS INFUSION - SIMPLE: 125.0000 mL/h | Freq: Once | INTRAVENOUS | Status: DC

## 2010-11-22 MED ORDER — LACTATED RINGERS IV SOLN
INTRAVENOUS | Status: DC
  Administered 2010-11-23 (×4): via INTRAVENOUS

## 2010-11-22 NOTE — Progress Notes (Addendum)
Kathryn Peterson is a 30 y.o. G2P0010 at [redacted]w[redacted]d Subjective: Doing well; had episode of ctx that were more painful earlier but they have since decreased  Objective: BP 124/74  Pulse 69  Temp(Src) 98.3 F (36.8 C) (Oral)  Resp 18  Ht 5\' 6"  (1.676 m)  Wt 95.709 kg (211 lb)  BMI 34.06 kg/m2      FHT:  FHR: 140 bpm, variability: moderate,  accelerations:  Present,  decelerations:  Absent UC:   irregular, every 5+ minutes SVE:   Dilation: 2.5 Effacement (%): 70 Station: -2 Exam by:: Kathryn Peterson, CNM  Labs: Lab Results  Component Value Date   WBC 11.1* 11/22/2010   HGB 10.8* 11/22/2010   HCT 33.6* 11/22/2010   MCV 92.8 11/22/2010   PLT 285 11/22/2010    Assessment / Plan: IOL for GDM  2nd cytotec placed to continue with induction process  Kathryn Peterson 11/22/2010, 1700
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
## 2010-11-22 NOTE — H&P (Signed)
Chart reviewed and agree with management and plan.  

## 2010-11-23 ENCOUNTER — Encounter (HOSPITAL_COMMUNITY): Payer: Self-pay

## 2010-11-23 ENCOUNTER — Encounter (HOSPITAL_COMMUNITY): Payer: Self-pay | Admitting: Anesthesiology

## 2010-11-23 ENCOUNTER — Inpatient Hospital Stay (HOSPITAL_COMMUNITY): Payer: Medicaid Other | Admitting: Anesthesiology

## 2010-11-23 ENCOUNTER — Encounter (HOSPITAL_COMMUNITY)
Admission: RE | Disposition: A | Discharge status: Home / Self Care | Payer: Self-pay | Source: Ambulatory Visit | Attending: Obstetrics & Gynecology

## 2010-11-23 DIAGNOSIS — O409XX0 Polyhydramnios, unspecified trimester, not applicable or unspecified: Secondary | ICD-10-CM

## 2010-11-23 DIAGNOSIS — O99810 Abnormal glucose complicating pregnancy: Secondary | ICD-10-CM

## 2010-11-23 SURGERY — Surgical Case
Anesthesia: Monitor Anesthesia Care | Site: Abdomen | Wound class: Clean Contaminated

## 2010-11-23 MED ORDER — ZOLPIDEM TARTRATE 5 MG PO TABS: 5.0000 mg | ORAL_TABLET | Freq: Every evening | ORAL | Status: DC | PRN

## 2010-11-23 MED ORDER — PHENYLEPHRINE HCL 10 MG/ML IJ SOLN
INTRAMUSCULAR | Status: DC | PRN
  Administered 2010-11-23: 40 ug via INTRAVENOUS
  Administered 2010-11-23: 80 ug via INTRAVENOUS

## 2010-11-23 MED ORDER — DIPHENHYDRAMINE HCL 25 MG PO CAPS: 25.0000 mg | ORAL_CAPSULE | ORAL | Status: DC | PRN

## 2010-11-23 MED ORDER — SERTRALINE HCL 100 MG PO TABS
100.0000 mg | ORAL_TABLET | Freq: Every day | ORAL | Status: DC
  Administered 2010-11-23 – 2010-11-25 (×3): 100 mg via ORAL
  Filled 2010-11-23 (×3): qty 1

## 2010-11-23 MED ORDER — DIPHENHYDRAMINE HCL 50 MG/ML IJ SOLN: 12.5000 mg | INTRAMUSCULAR | Status: DC | PRN

## 2010-11-23 MED ORDER — KETOROLAC TROMETHAMINE 30 MG/ML IJ SOLN
30.0000 mg | Freq: Four times a day (QID) | INTRAMUSCULAR | Status: AC | PRN
  Administered 2010-11-23: 30 mg via INTRAVENOUS

## 2010-11-23 MED ORDER — LACTATED RINGERS IV SOLN
INTRAVENOUS | Status: DC
  Administered 2010-11-24: 03:00:00 via INTRAVENOUS

## 2010-11-23 MED ORDER — DIBUCAINE 1 % RE OINT: 1.0000 "application " | TOPICAL_OINTMENT | RECTAL | Status: DC | PRN

## 2010-11-23 MED ORDER — SODIUM CHLORIDE 0.9 % IJ SOLN: 3.0000 mL | INTRAMUSCULAR | Status: DC | PRN

## 2010-11-23 MED ORDER — DIPHENHYDRAMINE HCL 25 MG PO CAPS
25.0000 mg | ORAL_CAPSULE | Freq: Four times a day (QID) | ORAL | Status: DC | PRN
  Administered 2010-11-24: 25 mg via ORAL
  Filled 2010-11-23: qty 1

## 2010-11-23 MED ORDER — CEFAZOLIN SODIUM-DEXTROSE 1-4 GM-% IV SOLR
1.0000 g | Freq: Once | INTRAVENOUS | Status: DC
Start: 2010-11-23 — End: 2010-11-23

## 2010-11-23 MED ORDER — ONDANSETRON HCL 4 MG/2ML IJ SOLN
INTRAMUSCULAR | Status: AC
  Filled 2010-11-23: qty 2

## 2010-11-23 MED ORDER — MIDAZOLAM HCL 2 MG/2ML IJ SOLN
INTRAMUSCULAR | Status: AC
  Filled 2010-11-23: qty 4

## 2010-11-23 MED ORDER — NALBUPHINE HCL 10 MG/ML IJ SOLN: 5.0000 mg | INTRAMUSCULAR | Status: DC | PRN

## 2010-11-23 MED ORDER — SCOPOLAMINE 1 MG/3DAYS TD PT72
MEDICATED_PATCH | TRANSDERMAL | Status: AC
  Filled 2010-11-23: qty 1

## 2010-11-23 MED ORDER — NALOXONE HCL 0.4 MG/ML IJ SOLN: 0.4000 mg | INTRAMUSCULAR | Status: DC | PRN

## 2010-11-23 MED ORDER — ONDANSETRON HCL 4 MG/2ML IJ SOLN: 4.0000 mg | Freq: Three times a day (TID) | INTRAMUSCULAR | Status: DC | PRN

## 2010-11-23 MED ORDER — BUPIVACAINE HCL (PF) 0.5 % IJ SOLN
INTRAMUSCULAR | Status: DC | PRN
  Administered 2010-11-23: 30 mL

## 2010-11-23 MED ORDER — MEPERIDINE HCL 25 MG/ML IJ SOLN: 6.2500 mg | INTRAMUSCULAR | Status: DC | PRN

## 2010-11-23 MED ORDER — ONDANSETRON HCL 4 MG PO TABS: 4.0000 mg | ORAL_TABLET | ORAL | Status: DC | PRN

## 2010-11-23 MED ORDER — SIMETHICONE 80 MG PO CHEW: 80.0000 mg | CHEWABLE_TABLET | ORAL | Status: DC | PRN

## 2010-11-23 MED ORDER — OXYCODONE-ACETAMINOPHEN 5-325 MG PO TABS
1.0000 | ORAL_TABLET | ORAL | Status: DC | PRN
Start: 2010-11-23 — End: 2010-11-26
  Administered 2010-11-24 – 2010-11-26 (×8): 1 via ORAL
  Filled 2010-11-23 (×8): qty 1

## 2010-11-23 MED ORDER — TETANUS-DIPHTH-ACELL PERTUSSIS 5-2.5-18.5 LF-MCG/0.5 IM SUSP
0.5000 mL | Freq: Once | INTRAMUSCULAR | Status: AC
  Administered 2010-11-23: 0.5 mL via INTRAMUSCULAR
  Filled 2010-11-23: qty 0.5

## 2010-11-23 MED ORDER — MORPHINE SULFATE 0.5 MG/ML IJ SOLN
INTRAMUSCULAR | Status: AC
  Filled 2010-11-23: qty 10

## 2010-11-23 MED ORDER — PHENYLEPHRINE 40 MCG/ML (10ML) SYRINGE FOR IV PUSH (FOR BLOOD PRESSURE SUPPORT)
PREFILLED_SYRINGE | INTRAVENOUS | Status: AC
  Filled 2010-11-23: qty 15

## 2010-11-23 MED ORDER — KETOROLAC TROMETHAMINE 30 MG/ML IJ SOLN
INTRAMUSCULAR | Status: AC
Start: 2010-11-23 — End: 2010-11-24
  Filled 2010-11-23: qty 1

## 2010-11-23 MED ORDER — SIMETHICONE 80 MG PO CHEW
80.0000 mg | CHEWABLE_TABLET | Freq: Three times a day (TID) | ORAL | Status: DC
  Administered 2010-11-23 – 2010-11-26 (×10): 80 mg via ORAL

## 2010-11-23 MED ORDER — SODIUM CHLORIDE 0.9 % IV SOLN: 1.0000 ug/kg/h | INTRAVENOUS | Status: DC | PRN

## 2010-11-23 MED ORDER — MIDAZOLAM HCL 2 MG/2ML IJ SOLN
INTRAMUSCULAR | Status: AC
  Filled 2010-11-23: qty 2

## 2010-11-23 MED ORDER — TERBUTALINE SULFATE 1 MG/ML IJ SOLN: 0.2500 mg | Freq: Once | INTRAMUSCULAR | Status: DC

## 2010-11-23 MED ORDER — OXYTOCIN 20 UNITS IN LACTATED RINGERS INFUSION - SIMPLE
INTRAVENOUS | Status: DC | PRN
  Administered 2010-11-23: 20 [IU] via INTRAVENOUS

## 2010-11-23 MED ORDER — IBUPROFEN 600 MG PO TABS
600.0000 mg | ORAL_TABLET | Freq: Four times a day (QID) | ORAL | Status: DC
  Administered 2010-11-24 – 2010-11-26 (×10): 600 mg via ORAL
  Filled 2010-11-23 (×3): qty 1

## 2010-11-23 MED ORDER — WITCH HAZEL-GLYCERIN EX PADS: 1.0000 "application " | MEDICATED_PAD | CUTANEOUS | Status: DC | PRN

## 2010-11-23 MED ORDER — OXYTOCIN 20 UNITS IN LACTATED RINGERS INFUSION - SIMPLE
125.0000 mL/h | INTRAVENOUS | Status: AC
  Administered 2010-11-23: 125 mL/h via INTRAVENOUS

## 2010-11-23 MED ORDER — HYDROMORPHONE HCL 1 MG/ML IJ SOLN: 0.2500 mg | INTRAMUSCULAR | Status: DC | PRN

## 2010-11-23 MED ORDER — MENTHOL 3 MG MT LOZG: 1.0000 | LOZENGE | OROMUCOSAL | Status: DC | PRN

## 2010-11-23 MED ORDER — IBUPROFEN 600 MG PO TABS
600.0000 mg | ORAL_TABLET | Freq: Four times a day (QID) | ORAL | Status: DC | PRN
  Filled 2010-11-23 (×7): qty 1

## 2010-11-23 MED ORDER — KETOROLAC TROMETHAMINE 60 MG/2ML IM SOLN: 60.0000 mg | Freq: Once | INTRAMUSCULAR | Status: AC | PRN

## 2010-11-23 MED ORDER — MORPHINE SULFATE (PF) 0.5 MG/ML IJ SOLN
INTRAMUSCULAR | Status: DC | PRN
  Administered 2010-11-23: .1 mg via INTRATHECAL

## 2010-11-23 MED ORDER — PNEUMOCOCCAL VAC POLYVALENT 25 MCG/0.5ML IJ INJ: 0.5000 mL | INJECTION | Freq: Once | INTRAMUSCULAR | Status: DC

## 2010-11-23 MED ORDER — NALOXONE HCL 0.4 MG/ML IJ SOLN
INTRAMUSCULAR | Status: AC
  Filled 2010-11-23: qty 1

## 2010-11-23 MED ORDER — FENTANYL CITRATE 0.05 MG/ML IJ SOLN
INTRAMUSCULAR | Status: DC | PRN
  Administered 2010-11-23: 15 ug via INTRATHECAL

## 2010-11-23 MED ORDER — SERTRALINE HCL 100 MG PO TABS
100.0000 mg | ORAL_TABLET | Freq: Every day | ORAL | Status: DC
  Filled 2010-11-23: qty 1

## 2010-11-23 MED ORDER — INFLUENZA VIRUS VACC SPLIT PF IM SUSP
0.5000 mL | Freq: Once | INTRAMUSCULAR | Status: DC
  Filled 2010-11-23: qty 0.5

## 2010-11-23 MED ORDER — LANOLIN HYDROUS EX OINT: 1.0000 "application " | TOPICAL_OINTMENT | CUTANEOUS | Status: DC | PRN

## 2010-11-23 MED ORDER — SENNOSIDES-DOCUSATE SODIUM 8.6-50 MG PO TABS
2.0000 | ORAL_TABLET | Freq: Every day | ORAL | Status: DC
  Administered 2010-11-23 – 2010-11-25 (×3): 2 via ORAL

## 2010-11-23 MED ORDER — MIDAZOLAM HCL 5 MG/5ML IJ SOLN
INTRAMUSCULAR | Status: DC | PRN
  Administered 2010-11-23 (×4): 1 mg via INTRAVENOUS

## 2010-11-23 MED ORDER — EPHEDRINE 5 MG/ML INJ
INTRAVENOUS | Status: AC
  Filled 2010-11-23: qty 10

## 2010-11-23 MED ORDER — ONDANSETRON HCL 4 MG/2ML IJ SOLN: 4.0000 mg | INTRAMUSCULAR | Status: DC | PRN

## 2010-11-23 MED ORDER — OXYTOCIN 20 UNITS IN LACTATED RINGERS INFUSION - SIMPLE
1.0000 m[IU]/min | INTRAVENOUS | Status: DC
  Administered 2010-11-23: 2 m[IU]/min via INTRAVENOUS
  Filled 2010-11-23: qty 1000

## 2010-11-23 MED ORDER — FENTANYL CITRATE 0.05 MG/ML IJ SOLN
INTRAMUSCULAR | Status: AC
Start: 2010-11-23 — End: 2010-11-23
  Filled 2010-11-23: qty 2

## 2010-11-23 MED ORDER — KETOROLAC TROMETHAMINE 30 MG/ML IJ SOLN: 30.0000 mg | Freq: Four times a day (QID) | INTRAMUSCULAR | Status: AC | PRN

## 2010-11-23 MED ORDER — BUPIVACAINE IN DEXTROSE 0.75-8.25 % IT SOLN
INTRATHECAL | Status: DC | PRN
  Administered 2010-11-23: 1.5 mL via INTRATHECAL

## 2010-11-23 MED ORDER — CEFAZOLIN SODIUM 1-5 GM-% IV SOLN
1.0000 g | Freq: Once | INTRAVENOUS | Status: AC
  Administered 2010-11-23: 1 g via INTRAVENOUS
  Filled 2010-11-23: qty 50

## 2010-11-23 MED ORDER — DIPHENHYDRAMINE HCL 50 MG/ML IJ SOLN: 25.0000 mg | INTRAMUSCULAR | Status: DC | PRN

## 2010-11-23 MED ORDER — TERBUTALINE SULFATE 1 MG/ML IJ SOLN: 0.2500 mg | Freq: Once | INTRAMUSCULAR | Status: DC | PRN

## 2010-11-23 MED ORDER — PRENATAL PLUS 27-1 MG PO TABS
1.0000 | ORAL_TABLET | Freq: Every day | ORAL | Status: DC
  Administered 2010-11-25 – 2010-11-26 (×2): 1 via ORAL
  Filled 2010-11-23 (×2): qty 1

## 2010-11-23 MED ORDER — METOCLOPRAMIDE HCL 5 MG/ML IJ SOLN: 10.0000 mg | Freq: Three times a day (TID) | INTRAMUSCULAR | Status: DC | PRN

## 2010-11-23 MED ORDER — NALBUPHINE SYRINGE 5 MG/0.5 ML
10.0000 mg | INJECTION | INTRAMUSCULAR | Status: DC | PRN
  Administered 2010-11-23: 10 mg via INTRAVENOUS
  Filled 2010-11-23 (×2): qty 1

## 2010-11-23 MED ORDER — SCOPOLAMINE 1 MG/3DAYS TD PT72
1.0000 | MEDICATED_PATCH | Freq: Once | TRANSDERMAL | Status: DC
  Administered 2010-11-23: 1.5 mg via TRANSDERMAL

## 2010-11-23 MED ORDER — RHO D IMMUNE GLOBULIN 1500 UNIT/2ML IJ SOLN
300.0000 ug | Freq: Once | INTRAMUSCULAR | Status: DC
  Filled 2010-11-23: qty 2

## 2010-11-23 MED ORDER — OXYTOCIN 20 UNITS IN LACTATED RINGERS INFUSION - SIMPLE
INTRAVENOUS | Status: AC
  Filled 2010-11-23: qty 1000

## 2010-11-23 MED ORDER — OXYTOCIN 10 UNIT/ML IJ SOLN
INTRAMUSCULAR | Status: AC
  Filled 2010-11-23: qty 4

## 2010-11-23 SURGICAL SUPPLY — 34 items
BENZOIN TINCTURE PRP APPL 2/3 (GAUZE/BANDAGES/DRESSINGS) ×2 IMPLANT
CHLORAPREP W/TINT 26ML (MISCELLANEOUS) ×2 IMPLANT
CLOTH BEACON ORANGE TIMEOUT ST (SAFETY) ×2 IMPLANT
CONTAINER PREFILL 10% NBF 15ML (MISCELLANEOUS) IMPLANT
DRESSING TELFA 8X3 (GAUZE/BANDAGES/DRESSINGS) IMPLANT
DRSG COVADERM 4X6 (GAUZE/BANDAGES/DRESSINGS) ×2 IMPLANT
ELECT REM PT RETURN 9FT ADLT (ELECTROSURGICAL) ×2
ELECTRODE REM PT RTRN 9FT ADLT (ELECTROSURGICAL) ×1 IMPLANT
GAUZE SPONGE 4X4 12PLY STRL LF (GAUZE/BANDAGES/DRESSINGS) IMPLANT
GLOVE BIO SURGEON STRL SZ 6.5 (GLOVE) ×4 IMPLANT
GOWN PREVENTION PLUS LG XLONG (DISPOSABLE) ×6 IMPLANT
KIT ABG SYR 3ML LUER SLIP (SYRINGE) IMPLANT
NEEDLE HYPO 25X5/8 SAFETYGLIDE (NEEDLE) IMPLANT
NEEDLE SPNL 18GX3.5 QUINCKE PK (NEEDLE) ×2 IMPLANT
NS IRRIG 1000ML POUR BTL (IV SOLUTION) ×2 IMPLANT
PACK C SECTION WH (CUSTOM PROCEDURE TRAY) ×2 IMPLANT
PAD ABD 7.5X8 STRL (GAUZE/BANDAGES/DRESSINGS) IMPLANT
SLEEVE SCD COMPRESS KNEE MED (MISCELLANEOUS) IMPLANT
STRIP CLOSURE SKIN 1/2X4 (GAUZE/BANDAGES/DRESSINGS) ×2 IMPLANT
SUT PDS AB 0 CTX 60 (SUTURE) IMPLANT
SUT VIC AB 0 CT1 27 (SUTURE)
SUT VIC AB 0 CT1 27XBRD ANBCTR (SUTURE) IMPLANT
SUT VIC AB 0 CT1 36 (SUTURE) IMPLANT
SUT VIC AB 2-0 CT1 27 (SUTURE) ×3
SUT VIC AB 2-0 CT1 TAPERPNT 27 (SUTURE) ×3 IMPLANT
SUT VIC AB 2-0 CTX 36 (SUTURE) ×4 IMPLANT
SUT VIC AB 3-0 CT1 27 (SUTURE) ×1
SUT VIC AB 3-0 CT1 TAPERPNT 27 (SUTURE) ×1 IMPLANT
SUT VIC AB 3-0 SH 27 (SUTURE)
SUT VIC AB 3-0 SH 27X BRD (SUTURE) IMPLANT
SYR 30ML LL (SYRINGE) ×2 IMPLANT
TOWEL OR 17X24 6PK STRL BLUE (TOWEL DISPOSABLE) ×4 IMPLANT
TRAY FOLEY CATH 14FR (SET/KITS/TRAYS/PACK) IMPLANT
WATER STERILE IRR 1000ML POUR (IV SOLUTION) IMPLANT

## 2010-11-23 NOTE — Progress Notes (Signed)
Subjective: Doing fairly well.  Mild discomfort.  Objective: BP 126/88  Pulse 81  Temp(Src) 97.7 F (36.5 C) (Oral)  Resp 18  Ht 5\' 6"  (1.676 m)  Wt 95.709 kg (211 lb)  BMI 34.06 kg/m2  SpO2 98%      FHT:  FHR: 120s bpm, variability: moderate,  accelerations:  Present,  decelerations:  Present late Late decel 2-3 minutes after AROM, responding to repositioning.  Head well applied to cervix.  No cord prolapse.  UC:  regular, every 3 minutes SVE:   Dilation: 5.5 Effacement (%): 70 Station: -2;-3 Exam by:: Dr. Adrian Blackwater  Labs: Lab Results  Component Value Date   WBC 11.1* 11/22/2010   HGB 10.8* 11/22/2010   HCT 33.6* 11/22/2010   MCV 92.8 11/22/2010   PLT 285 11/22/2010    Assessment / Plan: IOL for poly.  AROM with large amount of clear fluid  Labor: continue pit Preeclampsia:  NA Fetal Wellbeing:  Category II Pain Control:  Nubain I/D:  n/a Anticipated MOD:  NSVD  Kathryn Peterson JEHIEL 11/23/2010, 12:47 PM

## 2010-11-23 NOTE — Transfer of Care (Signed)
Immediate Anesthesia Transfer of Care Note  Patient: Kathryn Peterson  Procedure(s) Performed:  CESAREAN SECTION  Patient Location: PACU  Anesthesia Type: Spinal  Level of Consciousness: awake, alert , oriented and patient cooperative  Airway & Oxygen Therapy: Patient Spontanous Breathing  Post-op Assessment: Report given to PACU RN and Post -op Vital signs reviewed and stable  Post vital signs: Reviewed and stable  Complications: No apparent anesthesia complications

## 2010-11-23 NOTE — Progress Notes (Signed)
Ok for pt to ambulate in hallway for , then resume monitoring & start pitocin at that time. Per K. Clelia Croft, CNM.

## 2010-11-23 NOTE — Progress Notes (Signed)
Elease Swarm is a 30 y.o. G2P0010 at [redacted]w[redacted]d  Subjective: Pt reports ctx have decreased in freq and intensity; desires to be 'unhooked' from monitors/IV and amb 30-52mins  Objective: BP 123/74  Pulse 94  Temp(Src) 98.1 F (36.7 C) (Oral)  Resp 20  Ht 5\' 6"  (1.676 m)  Wt 95.709 kg (211 lb)  BMI 34.06 kg/m2  SpO2 98%      FHT:  FHR: 130 bpm, variability: moderate,  accelerations:  Present,  decelerations:  Absent UC:   irregular, every 2-5 minutes SVE:   Dilation: 4 Effacement (%): 70 Station: -2 Exam by:: l.poore ,rn  Labs: Lab Results  Component Value Date   WBC 11.1* 11/22/2010   HGB 10.8* 11/22/2010   HCT 33.6* 11/22/2010   MCV 92.8 11/22/2010   PLT 285 11/22/2010    Assessment / Plan: IOL process Latent phase  Pt will amb and then when returns will start Pitocin  SHAW, KIMBERLY 11/23/2010, 1:42 AM
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
## 2010-11-23 NOTE — Progress Notes (Signed)
  Ms. Donelan ctxs are adequate as documented by [facility].  FHR is reassuring, variables noted. Her cervix is 6 1/2  And baby is at -2 station. By my exam EFW is 8 1/2 pounds, relatively narrow pelvis. 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  I have recommended a C/S.  Because of her needle phobia, I will give her rhogam, tdap, pneumovac and flu vaccine while she is under anesthesia.  All questions were answered. Dr. Claudius Sis came in to speak with the patient.

## 2010-11-23 NOTE — Progress Notes (Signed)
Pt back from walking. Monitors reapplied

## 2010-11-23 NOTE — Anesthesia Postprocedure Evaluation (Signed)
Anesthesia Post Note  Patient: Kathryn Peterson  Procedure(s) Performed:  CESAREAN SECTION  Anesthesia type: Spinal  Patient location: PACU  Post pain: Pain level controlled  Post assessment: Post-op Vital signs reviewed  Last Vitals:  Filed Vitals:   11/23/10 2115  BP: 115/63  Pulse: 83  Temp: 97.3 F (36.3 C)  Resp: 16    Post vital signs: Reviewed  Level of consciousness: awake  Complications: No apparent anesthesia complications

## 2010-11-23 NOTE — Progress Notes (Signed)
Kathryn Peterson is a 30 y.o. G2P0010 at [redacted]w[redacted]d   Subjective: Breathing through ctx; doing well; declines pain meds at present  Objective: BP 138/94  Pulse 80  Temp(Src) 98 F (36.7 C) (Oral)  Resp 20  Ht 5\' 6"  (1.676 m)  Wt 95.709 kg (211 lb)  BMI 34.06 kg/m2  SpO2 98%      FHT:  FHR: 130 bpm, variability: moderate,  accelerations:  Present,  decelerations:  Absent UC:   regular, every 2 minutes with Pit at 75mu/min SVE:   Dilation: 5 Effacement (%): 70 Station: -2;Ballotable Exam by:: l.poore, rn  Labs: Lab Results  Component Value Date   WBC 11.1* 11/22/2010   HGB 10.8* 11/22/2010   HCT 33.6* 11/22/2010   MCV 92.8 11/22/2010   PLT 285 11/22/2010    Assessment / Plan: Induction of labor due to gestational diabetes,  progressing well on pitocin  Re eval cx in 1-2 hours or sooner if needed  Cam Hai 11/23/2010, 6:54 AM

## 2010-11-23 NOTE — Progress Notes (Signed)
Subjective: Feeling contractions severely.  Nubain helpful.  Objective: BP 134/80  Pulse 97  Temp(Src) 98 F (36.7 C) (Oral)  Resp 18  Ht 5\' 6"  (1.676 m)  Wt 95.709 kg (211 lb)  BMI 34.06 kg/m2  SpO2 98%      FHT:  FHR: 120s bpm, variability: moderate,  accelerations:  Present,  decelerations:  Present earlies UC:   regular, every 2-3 minutes SVE:   Dilation: 5.5 Effacement (%): 90 Station: -2;-1 Exam by:: L.Lyons, RN  Labs: Lab Results  Component Value Date   WBC 11.1* 11/22/2010   HGB 10.8* 11/22/2010   HCT 33.6* 11/22/2010   MCV 92.8 11/22/2010   PLT 285 11/22/2010    Assessment / Plan: IOL for poly.  IUPC placed.  Continue to titrate pitocin  Fetal Wellbeing:  Category II Pain Control:  nubain I/D:  n/a Anticipated MOD:  NSVD  Kathryn Peterson JEHIEL 11/23/2010, 4:07 PM

## 2010-11-23 NOTE — Op Note (Signed)
[name] PROCEDURE DATE: 11/23/2010  PREOPERATIVE DIAGNOSIS: Intrauterine pregnancy at  [redacted]w[redacted]d weeks gestation; failure to progress: arrest of dilation  POSTOPERATIVE DIAGNOSIS: The same  PROCEDURE:     Primary Low Transverse Cesarean Section  SURGEON:  Dr. Nicholaus Bloom  ASSISTANT: Dr Candelaria Celeste  INDICATIONS: Kathryn Peterson is a 30 y.o. G2P1011 at [redacted]w[redacted]d scheduled for cesarean section secondary to failure to progress: arrest of dilation.  The risks of cesarean section discussed with the patient included but were not limited to: bleeding which may require transfusion or reoperation; infection which may require antibiotics; injury to bowel, bladder, ureters or other surrounding organs; injury to the fetus; need for additional procedures including hysterectomy in the event of a life-threatening hemorrhage; placental abnormalities wth subsequent pregnancies, incisional problems, thromboembolic phenomenon and other postoperative/anesthesia complications. The patient concurred with the proposed plan, giving informed written consent for the procedure.    FINDINGS:  Viable female/female infant in cephalic presentation.  Apgars 6 and 8, weight, 8 pounds and 6 ounces.  Clear amniotic fluid.  Intact placenta, three vessel cord.  Normal uterus, fallopian tubes and ovaries bilaterally.  ANESTHESIA:    Spinal INTRAVENOUS FLUIDS: 2500 ml ESTIMATED BLOOD LOSS: 600 ml URINE OUTPUT:  250 ml SPECIMENS: Placenta sent to L&D COMPLICATIONS: None immediate  PROCEDURE IN DETAIL:  The patient received intravenous antibiotics and had sequential compression devices applied to her lower extremities while in the preoperative area.  She was then taken to the operating room where spinal anesthesia was administered  and was found to be adequate. She was then placed in a dorsal supine position with a leftward tilt, and prepped and draped in a sterile manner.  A foley catheter was placed into her bladder and attached to  constant gravity. The bladder drained clear urine, which remained clear throughout the entire operation.  An adequate timeout was performed.  Prior to the incision, Marcaine 0.25% 30mL was injected into the skin.  A Pfannenstiel skin incision was then made with scalpel and carried through to the underlying layer of fascia. The fascia was incised in the midline and this incision was extended bilaterally using the Mayo scissors. A 1 cm midline incision was made through the rectus muscle on either side with cautery with hemostasis.  The peritoneum was then entered bluntly and a bladder blade was placed.  Attention was turned to the lower uterine segment where a transverse hysterotomy was made with a scalpel and extended bilaterally bluntly. The bladder blade was then removed. The infant was noted to be floating high in the uterus and not engaged in the pelvis.  The infant was successfully delivered and a nuchal cord x 1 was reduced.  The cord was clamped and cut and infant was handed over to awaiting neonatology team. Uterine massage was then administered and the placenta delivered intact with three-vessel cord. The uterus was then cleared of clot and debris.  The hysterotomy was closed with 2-0 CTX in a running locked fashion in a single layer closure.  Overall, hemostasis was noted. The fascia was then closed using 0 Vicryl in a running fashion.  The subcutaneous layer was irrigated and the skin was closed with 3-0 Vicryl.  The patient tolerated the procedure well. Sponge, lap, instrument and needle counts were correct x 2. She was taken to the recovery room in stable condition.    Candelaria Celeste JEHIEL DO 11/23/2010 6:58 PM

## 2010-11-23 NOTE — Anesthesia Procedure Notes (Addendum)
Spinal Block  Patient location during procedure: OR Start time: 11/23/2010 6:20 PM Staffing Performed by: anesthesiologist  Preanesthetic Checklist Completed: patient identified, site marked, surgical consent, pre-op evaluation, timeout performed, IV checked, risks and benefits discussed and monitors and equipment checked Spinal Block Patient position: sitting Prep: site prepped and draped and DuraPrep Patient monitoring: heart rate, cardiac monitor, continuous pulse ox and blood pressure Approach: midline Location: L3-4 Injection technique: single-shot Needle Needle type: Sprotte  Needle gauge: 24 G Needle length: 9 cm Assessment Sensory level: T4 Additional Notes Clear free flow CSF on 1st attempt.  Pt sedated with Versed at her request, but still awake and able to communicate.

## 2010-11-23 NOTE — Progress Notes (Signed)
Subjective: MOre comfortable after nubain.  Objective: BP 103/70  Pulse 88  Temp(Src) 97.6 F (36.4 C) (Oral)  Resp 18  Ht 5\' 6"  (1.676 m)  Wt 95.709 kg (211 lb)  BMI 34.06 kg/m2  SpO2 98%      FHT:  FHR: 130s bpm, variability: moderate,  accelerations:  Present,  decelerations:  Absent UC:   regular, every 2-3 minutes SVE:   Dilation: 5.5 Effacement (%): 70 Station: -2;-3 Exam by:: Dr. Adrian Blackwater  Labs: Lab Results  Component Value Date   WBC 11.1* 11/22/2010   HGB 10.8* 11/22/2010   HCT 33.6* 11/22/2010   MCV 92.8 11/22/2010   PLT 285 11/22/2010    Assessment / Plan: IOL for GDM, polyhydramnios\  Labor: pitocin. Preeclampsia:  NA Fetal Wellbeing:  Category I Pain Control:  nubain I/D:  n/a Anticipated MOD:  NSVD  Kathryn Peterson 11/23/2010, 9:52 AM

## 2010-11-23 NOTE — Progress Notes (Signed)
FHR indeterminate. Readjusting cardio.

## 2010-11-24 MED ORDER — LACTATED RINGERS IV SOLN: Freq: Once | INTRAVENOUS | Status: DC

## 2010-11-24 NOTE — Progress Notes (Signed)
Encounter addended by: Madison Hickman on: 11/24/2010  8:15 AM<BR>     Documentation filed: Notes Section

## 2010-11-24 NOTE — Progress Notes (Signed)
UR chart review completed.  

## 2010-11-24 NOTE — Progress Notes (Signed)

## 2010-11-24 NOTE — Anesthesia Postprocedure Evaluation (Signed)
  Anesthesia Post-op Note  Patient: Kathryn Peterson  Procedure(s) Performed:  CESAREAN SECTION  Patient Location: Mother/Baby  Anesthesia Type: Spinal  Level of Consciousness: awake, alert  and oriented  Airway and Oxygen Therapy: Patient Spontanous Breathing  Post-op Pain: none  Post-op Assessment: Post-op Vital signs reviewed, Patient's Cardiovascular Status Stable, No headache, No backache, No residual numbness and No residual motor weakness  Post-op Vital Signs: Reviewed and stable  Complications: No apparent anesthesia complications

## 2010-11-24 NOTE — Progress Notes (Signed)
Post Partum Day 1/POD #1; PLTCS for CPD Subjective: no complaints, up ad lib, voiding and tolerating PO, small lochia, plans to breastfeed, no method (pt counseled, still unsure if she is going to use [facility]).  PT's IV infiltrated last night, and she refused to have it replaced.  She has been pushing PO fluids  Objective: Blood pressure 147/84, pulse 102, temperature 98.5 F (36.9 C), temperature source Oral, resp. rate 20, height 5\' 6"  (1.676 m), weight 95.709 kg (211 lb), SpO2 97.00%, unknown if currently breastfeeding. (PT is currently breastfeeding)  Physical Exam:  General: alert, cooperative and no distress Lochia:normal flow Chest: CTAB Heart: RRR no m/r/g Abdomen: +BS, soft, nontender, Dsg dry and intact Uterine Fundus: firm @ U-2 DVT Evaluation: No evidence of DVT seen on physical exam. 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

## 2010-11-25 LAB — GLUCOSE, CAPILLARY: Glucose-Capillary: 109 mg/dL — ABNORMAL HIGH (ref 70–99)

## 2010-11-25 NOTE — Progress Notes (Signed)
Subjective: Postpartum Day 2: Cesarean Delivery for CPD.  Patient reports minimal epigastric pain; tolerating PO   Contraception method: undetermined      Objective: Vital signs in last 24 hours: Temp:  [97.9 F (36.6 C)-99.8 F (37.7 C)] 97.9 F (36.6 C) (10/12 0452) Pulse Rate:  [75-101] 75  (10/12 0452) Resp:  [18-20] 18  (10/12 0452) BP: (112-132)/(73-86) 117/74 mmHg (10/12 0452) SpO2:  [98 %-99 %] 98 % (10/11 1730)  I/O last 3 completed shifts: In: 7652.1 [P.O.:5205; I.V.:2447.1] Out: 4575 [Urine:4575]   CBG (last 3)   Basename 11/25/10 0619 11/23/10 2048  GLUCAP 109* 106*      Physical Exam:  General: alert, cooperative and no distress Lochia: appropriate Uterine Fundus: firm Incision: healing well, no significant drainage, no dehiscence DVT Evaluation: No evidence of DVT seen on physical exam.   Basename 11/22/10 1030  HGB 10.8*  HCT 33.6*    Assessment/Plan: Status post Cesarean section. Doing well postoperatively.  Discharge home with standard precautions and return to clinic in 4-6 weeks.  Clinton Sawyer, Demetries Coia 11/25/2010, 7:40 AM

## 2010-11-26 MED ORDER — IBUPROFEN 800 MG PO TABS: 800.0000 mg | ORAL_TABLET | Freq: Three times a day (TID) | ORAL | Status: AC

## 2010-11-26 MED ORDER — OXYCODONE-ACETAMINOPHEN 5-325 MG PO TABS: 2.0000 | ORAL_TABLET | ORAL | Status: AC | PRN

## 2010-11-26 NOTE — Discharge Summary (Signed)
Obstetric Discharge Summary Reason for Admission: induction of labor for Gestational Diabetes Prenatal Procedures: NST and ultrasound Intrapartum Procedures: cesarean: low cervical, transverse Postpartum Procedures: Rho(D) Ig Complications-Operative and Postpartum: none Hemoglobin  Date Value Range Status  11/22/2010 10.8* 12.0-15.0 (g/dL) Final     HCT  Date Value Range Status  11/22/2010 33.6* 36.0-46.0 (%) Final    Discharge Diagnoses: Term Pregnancy-delivered  Discharge Information: Date: 11/26/2010 Activity: pelvic rest Diet: routine Medications: PNV, Ibuprofen, Colace, Iron and Percocet Condition: stable Instructions: refer to practice specific booklet Discharge to: home Follow-up Information    Please follow up. (At [facility] as scheduled)          Newborn Data: Live born female  Birth Weight: 8 lb 6.4 oz (3810 g) APGAR: 6, 8  Home with mother.  Yoceline Bazar N 11/26/2010, 7:37 AM

## 2010-11-26 NOTE — Progress Notes (Signed)
Subjective: Postpartum Day 3: Cesarean Delivery, POD#3 Patient reports incisional pain, tolerating PO, + flatus, + BM and no problems voiding.  Breastfeeding. Already has her follow-up appointment at [facility].  Objective: Vital signs in last 24 hours: Temp:  [97.8 F (36.6 C)-98.3 F (36.8 C)] 98.3 F (36.8 C) (10/13 0625) Pulse Rate:  [69-96] 69  (10/13 0625) Resp:  [18-20] 20  (10/13 0625) BP: (107-114)/(68-73) 114/72 mmHg (10/13 1610)  Physical Exam:  General: alert, cooperative and no distress Heart: RRR, no murmurs Lungs: CTA B/L Lochia: appropriate Uterine Fundus: firm Incision: healing well DVT Evaluation: No evidence of DVT seen on physical exam.  No results found for this basename: HGB:2,HCT:2 in the last 72 hours  Assessment/Plan: Status post Cesarean section. Doing well postoperatively.  Discharge home with standard precautions and return to clinic in 4-6 weeks.  Naithen Rivenburg N 11/26/2010, 7:32 AM

## 2010-11-27 ENCOUNTER — Encounter (HOSPITAL_COMMUNITY): Payer: Self-pay | Admitting: Obstetrics & Gynecology

## 2010-11-29 ENCOUNTER — Inpatient Hospital Stay (HOSPITAL_COMMUNITY): Admission: AD | Admit: 2010-11-29 | Payer: Self-pay | Source: Ambulatory Visit | Admitting: Family Medicine

## 2010-12-16 ENCOUNTER — Encounter: Payer: Self-pay | Admitting: Advanced Practice Midwife

## 2010-12-16 ENCOUNTER — Ambulatory Visit (INDEPENDENT_AMBULATORY_CARE_PROVIDER_SITE_OTHER): Payer: Medicaid Other | Admitting: Advanced Practice Midwife

## 2010-12-16 DIAGNOSIS — Z09 Encounter for follow-up examination after completed treatment for conditions other than malignant neoplasm: Secondary | ICD-10-CM

## 2010-12-16 DIAGNOSIS — F411 Generalized anxiety disorder: Secondary | ICD-10-CM

## 2010-12-16 DIAGNOSIS — Z98891 History of uterine scar from previous surgery: Secondary | ICD-10-CM

## 2010-12-16 DIAGNOSIS — F419 Anxiety disorder, unspecified: Secondary | ICD-10-CM | POA: Insufficient documentation

## 2010-12-16 DIAGNOSIS — F40298 Other specified phobia: Secondary | ICD-10-CM

## 2010-12-16 NOTE — Progress Notes (Signed)
  Subjective:    Patient ID: Leonard Downing, female    DOB: 09-23-1980, 30 y.o.   MRN: [id number]  HPI Presents for incision check/post op check s/p C/S on 12/01/10.  Has done well post op with good bowel and bladder function, and decreasing pain. Has had some issues with breastfeeding > working with Peds on that.  No redness or drainage from incision.  Has been on Zoloft for her anxiety. Now has had some crying spells this week. Has counselor that she likes.  Review of Systems As above    Objective:   Physical Exam  Abdomen soft and appropriately tender.  Incision with steristrips clean and intact. Steristrips removed. There is a small area of excoriation in the center of the incision from the adhesive.      Assessment & Plan:  A:  PostOp X 2 weeks      Incision healing well. Steristrips removed.\     Return in 4 weeks     Pt agrees to call counselor today to help with coping with being a new mom and the stress in her life. Will not increase Zoloft at this time unless her counselor suggests it. Pt has good support from family and is agreeable.

## 2011-01-02 ENCOUNTER — Ambulatory Visit: Payer: Medicaid Other

## 2011-01-02 ENCOUNTER — Encounter: Payer: Self-pay | Admitting: Advanced Practice Midwife

## 2011-01-02 ENCOUNTER — Ambulatory Visit (INDEPENDENT_AMBULATORY_CARE_PROVIDER_SITE_OTHER): Payer: Medicaid Other | Admitting: Advanced Practice Midwife

## 2011-01-02 DIAGNOSIS — O99345 Other mental disorders complicating the puerperium: Secondary | ICD-10-CM

## 2011-01-02 DIAGNOSIS — IMO0002 Reserved for concepts with insufficient information to code with codable children: Secondary | ICD-10-CM

## 2011-01-02 NOTE — Progress Notes (Signed)
  Subjective:     Kathryn Peterson is a 30 y.o. female who presents for a postpartum visit. She is 6 weeks postpartum following a low cervical transverse Cesarean section. I have fully reviewed the prenatal and intrapartum course. The delivery was at 39.1 gestational weeks. Outcome: primary cesarean section, low transverse incision. Anesthesia: spinal. Postpartum course has been uncomplicated except for postpartum depression. Pt was experiencing increased depressive sx at last visit, has since visited her counselor and is feeling much better. Baby's course has been complicted by low weight gain and acid reflux. Baby is feeding by both breast and bottle - supplementing with formula d/t low weight gain/low production. Bleeding staining only. Bowel function is normal. Bladder function is normal. Patient is not sexually active.  Postpartum depression screening: positive. Pt states she is feeling emotionally well now, states she answered questions on depression screening for entire postpartum period, not current feelings, was having depression issues a few weeks ago. Pregnancy was complicated by GDM.     Review of Systems Pertinent items are noted in HPI.   Objective:    BP 108/72  Pulse 73  Temp(Src) 97.1 F (36.2 C) (Oral)  Resp 16  Ht 5\' 5"  (1.651 m)  Wt 80.74 kg (178 lb)  BMI 29.62 kg/m2  Breastfeeding? Yes  General:  alert, cooperative, no distress and moderately obese   Breasts:  inspection negative, no nipple discharge or bleeding, no masses or nodularity palpable  Abdomen: soft, non-tender; bowel sounds normal; no masses,  no organomegaly and c/s incision well healed        Assessment:    6 week postpartum exam. Pap smear not due at today's visit.   Plan:     1. Recommended 2 hour GTT d/t history of GDM with this pregnancy. Pt declines d/t severe needle phobia. Agrees to check fasting blood sugars at home. Rev'd increased risk of DM outside of pregnancy, pt states understanding.  2.  Follow up in: 1 year or as needed.

## 2011-01-02 NOTE — Patient Instructions (Signed)
Postpartum Depression After delivery, your body is going through a drastic change in hormone levels. You may find yourself crying for no apparent reason and unable to cope with all the changes a new baby brings. This is a common response following a pregnancy. Seek support from your partner and/or friends and just give yourself time to recover. If these feelings persist and you feel you are getting worse, contact your caregiver or other professionals who can help you. WHAT IS DEPRESSION? Depression can be described as feeling sad, blue, unhappy, miserable, or down in the dumps. Most of [place] feel this way at one time or another for short periods. But true clinical depression is a mood disorder in which feelings of sadness, loss, anger, fear, or frustration interfere with everyday life for an extended time. Depression can be mild, moderate, or severe. The degree of depression, which your caregiver can determine, influences your treatment. Postpartum depression occurs within a couple days to months after delivering your baby. HOW COMMON IS DEPRESSION DURING AND AFTER PREGNANCY? Depression that occurs during pregnancy or within a year after delivery is called perinatal depression. Depression after pregnancy is also called postpartum depression or peripartum depression. The exact number of women with depression during this time is unknown, but it occurs in between 10-15% of women. Researchers believe that depression is one of the most common complications during and after pregnancy. The depression is often not recognized or treated, because some normal pregnancy changes cause similar symptoms and are happening at the same time. Tiredness, problems sleeping, stronger emotional reactions, and changes in body weight may occur during and after pregnancy. But these symptoms may also be signs of depression.  CAUSES  Rapid hormone changes. Estrogen and progesterone usually decrease immediately after delivering your  baby. Researchers think the fast change in hormone levels may lead to depression, just as smaller changes in hormones can affect a woman's moods before she gets her menstrual period.   Decrease in thyroid hormone. Thyroid hormone regulates how your body uses and stores energy from food (metabolism). A simple blood test can tell if this condition is causing a woman's depression. If so, thyroid medicine can be prescribed by your caregiver.   A stressful life event, such as a death in the family. This can cause chemical changes in the brain that lead to depression.   Feeling overwhelmed by caring for and raising a new baby.   Depression is also an illness that runs in some families. It is not always clear what causes depression.  FACTORS THAT MAY INCREASE A WOMAN'S CHANCE OF DEPRESSION DURING PREGNANCY:  History of depression.   Substance abuse, alcohol, or drugs.   Little support from family and friends.   Problems with previous pregnancy or birth.   Young age for motherhood.   Living alone.   Little or no social support.   Family history of mental illness.   Anxiety about the fetus.   Marital or financial problems.   Postpartum depression in a previous pregnancy.   Having a psychiatric illness (schizophrenia, bipolar disorder).   Going through a difficult or stressful pregnancy.   Going through a difficult labor and delivery.   Moving to another city or state during your pregnancy, or just after delivering your baby.  OTHER FACTORS THAT MAY CONTRIBUTE TO POSTPARTUM DEPRESSION INCLUDE:   Feeling tired after delivery, broken sleep patterns, and not getting enough rest. This often keeps a new mother from regaining her full strength for weeks.   Feeling  overwhelmed with a new baby to take care of and doubting your ability to be a good mother.   Feeling stress from changes in work and home routines. Women sometimes think they need to be "super mom" or perfect. This is not  realistic and can add stress.   Having feelings of loss. This can include loss of the identity of who you are, or were, before having the baby, loss of control, loss of your pre-pregnancy figure, and feeling less attractive.   Having less free time and less control over your time. Needing to stay home, indoors, for longer periods of time and having less time to spend with your partner and loved ones can contribute to depression.   Having trouble doing your daily activities at home or at work.   Fears about not knowing how to take of the baby correctly and about harming the baby.   Feelings of guilt that you are not taking care of the baby properly.  SYMPTOMS Any of these symptoms, during and after pregnancy, that last longer than 2 weeks are signs of depression:  Feeling restless or irritable.   Feeling sad, hopeless, and overwhelmed.   Crying a lot.   Having no energy or motivation.   Eating too little or too much.   Sleeping too little or too much.   Trouble focusing, remembering, or making decisions.   Feeling worthless and guilty.   Loss of interest or pleasure in activities.   Withdrawal from friends and family.   Having headaches, chest pains, rapid or irregular heartbeat (palpitations), or fast and shallow breathing (hyperventilation).   After pregnancy, being afraid of hurting the baby or oneself, and not having any interest in the baby.   Not being able to care for yourself or the baby.   Loss of interest in caring for the baby.   Anxiety and panic attacks.   Thoughts of harming yourself, the baby, or someone else.   Feelings of guilt because you feel you are not taking care of the baby well enough.  WHAT IS THE DIFFERENCE BETWEEN "BABY BLUES," POSTPARTUM DEPRESSION, AND POSTPARTUM PSYCHOSIS?  The "baby blues" occurs 70 to 80% of the time, and it can happen in the days right after childbirth. It normally goes away within a few days to a week. A new mother can  have sudden mood swings, sadness, crying spells, loss of appetite, sleeping problems, and feel irritable, restless, anxious, and lonely. Symptoms are not severe and treatment usually is not needed. But there are things you can do to feel better. Nap when the baby does. Ask for help from your spouse, family members, and friends. Join a support group of new moms or talk with other moms. If the "baby blues" does not go away in a week to 10 days or gets worse, you may have postpartum depression.   Postpartum depression can happen anytime within the first year after childbirth. A woman may have a number of symptoms, such as sadness, lack of energy, trouble concentrating, anxiety, and feelings of guilt and worthlessness. The difference between postpartum depression and the "baby blues" is that the feelings in postpartum depression are much stronger and often affects a woman's well-being. It keeps her from functioning well for a longer period of time. Postpartum depression needs to be treated by a caregiver. Counseling, support groups, and medicines can help.   Postpartum psychosis is rare. It occurs in 1 or 2 out of every 1000 births. It usually begins in  the first 6 weeks after delivery. Women who have bipolar disorder, schizoaffective disorder, or family history of psychotic disease have a higher risk for developing postpartum psychosis. Symptoms may include delusions, hallucinations, sleep disturbances, and obsessive thoughts about the baby. A woman may have rapid mood swings, from depression, to irritability, to euphoria. This is a serious condition and needs professional care and treatment.  WHAT STEPS CAN I TAKE IF I HAVE SYMPTOMS OF DEPRESSION DURING PREGNANCY OR AFTER CHILDBIRTH?  Some women do not tell anyone about their symptoms, because they feel embarrassed, ashamed, or guilty about feeling depressed when they are supposed to be happy. They worry that they will be viewed as unfit parents. Perinatal  depression can happen to any woman. It does not mean you are a bad or a "not together" mom. You and your baby do not need to suffer. There is help. You should discuss these feelings with your spouse or partner, family, and caregiver.   There are different types of individual and group "talk therapies" that can help a woman with perinatal depression feel better and do better as a mom and as a person. Limited research suggests that many women with perinatal depression improve when treated with antidepressant medicine. Your caregiver can help you learn more about these options and decide which approach is best for you and your baby.   Speak to your caregiver if you are having symptoms of depression while you are pregnant or after you deliver your baby. Your caregiver can give you a questionnaire to test for depression. You can also be referred to a mental health professional who specializes in treating depression.  HOME CARE INSTRUCTIONS  Try to get as much rest as you can. Try to nap when the baby naps.   Stop putting pressure on yourself to do everything. Do as much as you can and leave the rest.   Ask for help with household chores and nighttime feedings. Ask your partner to bring the baby to you so you can breastfeed. If you can, have a friend, family member, or professional support person help you in the home for part of the day.   Talk to your partner, family, and friends about how you are feeling.   Do not spend a lot of time alone. Get dressed and leave the house. Run an errand or take a short walk.   Spend time alone with your partner.   Talk with other mothers so you can learn from their experiences.   Join a support group for women with depression. Call a local hotline or look in your telephone book for information and services.   Do not make any major life changes during pregnancy. Major changes can cause unneeded stress. However, sometimes big changes cannot be avoided. Arrange  support and help in your new situation ahead of time.   Exercise regularly.   Eat a balanced and nourishing diet.   Seek help if there are marital or financial problems.   Take the medicine your caregiver gives, as directed.   Keep all your postpartum appointments.  TREATMENT There are 2 common types of treatment for depression.  Talk therapy. This involves talking to a therapist, psychologist, clergyperson, or social worker, in order to learn to change how depression makes you think, feel, and act.   Medicine. Your caregiver can give you an antidepressant medicine to help you. These medicines can help relieve the symptoms of depression.   Women who are pregnant or breast-feeding should talk with  their caregivers about the advantages and risks of taking antidepressant medicines. Some women are concerned that taking these medicines may harm the baby. A mother's depression can affect her baby's development. Getting treatment is important for both mother and baby. The risks of taking medicine must be weighed against the risks of depression. It is a decision that women need to discuss carefully with their caregivers. Women who decide to take antidepressant medicines should talk to their caregivers about which antidepressant medicines are safer to take while pregnant or breastfeeding.  What effects can untreated depression have?  Depression not only hurts the mother, but it also affects her family. Some researchers have found that depression during pregnancy can raise the risk of delivering an underweight baby or a premature infant. Some women with depression have difficulty caring for themselves during pregnancy. They may have trouble eating and do not gain enough weight during the pregnancy. They may also have trouble sleeping, may miss prenatal visits, may not follow medical instructions, have a poor diet, or may use harmful substances, like tobacco, alcohol, or illegal drugs.   Postpartum  depression can affect a mother's ability to parent. She may lack energy, have trouble concentrating, be irritable, and not be able to meet her child's needs for love and affection. As a result, she may feel guilty and lose confidence in herself as a mother. This can make the depression worse. Researchers believe that postpartum depression can affect the infant by causing delays in language development, problems with emotional bonding to others, behavioral problems, lower activity levels, sleep problems, and distress. It helps if the father or another caregiver can assist in meeting the needs of the baby, and other children in the family, while the mother is depressed.   All children deserve the chance to have a healthy mom. All moms deserve the chance to enjoy their life and their children. Do not suffer alone. If you are experiencing symptoms of depression during pregnancy or after having a baby, tell a loved one and call your caregiver right away.  SEEK MEDICAL CARE IF:  You think you have postpartum depression.   You want medicine to treat your postpartum depression.   You want a referral to a psychiatrist or psychologist.   You are having a reaction or problems with your medicine.  SEEK IMMEDIATE MEDICAL CARE IF:  You have suicidal feelings.   You feel you may harm the baby.   You feel you may harm your spouse/partner, or someone else.   You feel you need to be admitted to a hospital now.   You feel you are losing control and need treatment immediately.  FOR MORE INFORMATION [employer] operational [employer] Health Information Center: http://hoffman.com/ National Institute of Mental Health, NIH, HHS: http://www.maynard.net/ American Psychological Association: DiceTournament.ca  Postpartum Education for Parents: www.sbpep.org National Mental Health Information Center, SAMHSA, HHS: www.mentalhealth.org  National Mental Health Association: www.nmha.org Postpartum Support International: www.postpartum.net    Document Released: 11/04/2003 Document Revised: 10/12/2010 Document Reviewed: 02/11/2009 [facility] Patient Information 2012 [place], [place].

## 2011-02-01 ENCOUNTER — Other Ambulatory Visit: Payer: Self-pay | Admitting: Advanced Practice Midwife

## 2011-07-18 ENCOUNTER — Other Ambulatory Visit: Payer: Self-pay | Admitting: Advanced Practice Midwife

## 2012-01-26 ENCOUNTER — Ambulatory Visit (INDEPENDENT_AMBULATORY_CARE_PROVIDER_SITE_OTHER): Payer: Medicaid Other | Admitting: Advanced Practice Midwife

## 2012-01-26 ENCOUNTER — Encounter: Payer: Self-pay | Admitting: Advanced Practice Midwife

## 2012-01-26 ENCOUNTER — Other Ambulatory Visit (HOSPITAL_COMMUNITY)
Admission: RE | Admit: 2012-01-26 | Discharge: 2012-01-26 | Disposition: A | Discharge status: Home / Self Care | Payer: Medicaid Other | Source: Ambulatory Visit | Attending: Obstetrics & Gynecology | Admitting: Obstetrics & Gynecology

## 2012-01-26 ENCOUNTER — Encounter: Payer: Self-pay | Admitting: *Deleted

## 2012-01-26 VITALS — BP 120/81 | Temp 98.4°F | Wt 195.0 lb

## 2012-01-26 DIAGNOSIS — Z113 Encounter for screening for infections with a predominantly sexual mode of transmission: Secondary | ICD-10-CM | POA: Insufficient documentation

## 2012-01-26 DIAGNOSIS — Z3480 Encounter for supervision of other normal pregnancy, unspecified trimester: Secondary | ICD-10-CM

## 2012-01-26 MED ORDER — SERTRALINE HCL 50 MG PO TABS: 50.0000 mg | ORAL_TABLET | Freq: Every day | ORAL | Status: DC

## 2012-01-26 MED ORDER — ACCU-CHEK FASTCLIX LANCETS MISC: 1.0000 [IU] | Freq: Four times a day (QID) | Status: DC

## 2012-01-26 MED ORDER — LORAZEPAM 2 MG PO TABS: 2.0000 mg | ORAL_TABLET | Freq: Once | ORAL | Status: DC

## 2012-01-26 MED ORDER — PRENATAL MV-MIN-FE CBN-FA-DHA 27-0.8-200 MG PO CAPS: 1.0000 | ORAL_CAPSULE | Freq: Every day | ORAL | Status: DC

## 2012-01-26 MED ORDER — GLUCOSE BLOOD VI STRP: ORAL_STRIP | Status: DC
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
## 2012-01-26 NOTE — Progress Notes (Signed)
p-75  Bedside U/S shows GA of 11w 4days  FHT 153 BPM

## 2012-02-23 ENCOUNTER — Ambulatory Visit (INDEPENDENT_AMBULATORY_CARE_PROVIDER_SITE_OTHER): Payer: Medicaid Other | Admitting: Family

## 2012-02-23 VITALS — BP 118/80 | Wt 193.0 lb

## 2012-02-23 DIAGNOSIS — O24919 Unspecified diabetes mellitus in pregnancy, unspecified trimester: Secondary | ICD-10-CM

## 2012-02-23 DIAGNOSIS — Z3480 Encounter for supervision of other normal pregnancy, unspecified trimester: Secondary | ICD-10-CM

## 2012-02-23 MED ORDER — CONCEPT DHA 53.5-38-1 MG PO CAPS: 1.0000 | ORAL_CAPSULE | Freq: Every day | ORAL | Status: DC

## 2012-02-23 NOTE — Progress Notes (Signed)
OB labs obtained today (lots of screaming due to phobia); due to phobia will not do 28 wks labs; pt will check BSs since history of GDM; reports FBS over 120, with postprandials wnl; advised to check blood sugars every day and if elevated will start HS glyburide; bring in log next week for check.  Anatomy ultrasound scheduled for three weeks.

## 2012-02-23 NOTE — Addendum Note (Signed)
Addended by: Granville Lewis on: 02/23/2012 12:24 PM   Modules accepted: Orders

## 2012-02-23 NOTE — Progress Notes (Signed)
p-92  PNL to be drawn today and pt c/o [facility]

## 2012-02-26 LAB — OBSTETRIC PANEL
Eosinophils Absolute: 0.3 10*3/uL (ref 0.0–0.7)
Hemoglobin: 12.5 g/dL (ref 12.0–15.0)
Hepatitis B Surface Ag: NEGATIVE
Lymphocytes Relative: 25 % (ref 12–46)
Lymphs Abs: 3.3 10*3/uL (ref 0.7–4.0)
MCH: 31.3 pg (ref 26.0–34.0)
MCV: 89.3 fL (ref 78.0–100.0)
Monocytes Relative: 7 % (ref 3–12)
Neutrophils Relative %: 66 % (ref 43–77)
RBC: 4 MIL/uL (ref 3.87–5.11)
Rh Type: NEGATIVE
Rubella: 0.38 Index (ref <0.90)
WBC: 13.5 10*3/uL — ABNORMAL HIGH (ref 4.0–10.5)

## 2012-02-28 LAB — GC/CHLAMYDIA PROBE AMP
CT Probe RNA: NEGATIVE
GC Probe RNA: NEGATIVE

## 2012-02-28 LAB — CULTURE, URINE COMPREHENSIVE

## 2012-02-29 ENCOUNTER — Telehealth: Payer: Self-pay | Admitting: *Deleted

## 2012-02-29 DIAGNOSIS — N390 Urinary tract infection, site not specified: Secondary | ICD-10-CM

## 2012-02-29 MED ORDER — NITROFURANTOIN MONOHYD MACRO 100 MG PO CAPS: 100.0000 mg | ORAL_CAPSULE | Freq: Two times a day (BID) | ORAL | Status: DC

## 2012-02-29 NOTE — Telephone Encounter (Signed)
Pt informed that her urine culture is positive and RX for Macrobid was sent to pharmacy per telephone order of Sid Falcon, CNM.

## 2012-03-01 ENCOUNTER — Ambulatory Visit (INDEPENDENT_AMBULATORY_CARE_PROVIDER_SITE_OTHER): Payer: Medicaid Other | Admitting: Advanced Practice Midwife

## 2012-03-01 VITALS — BP 111/69 | Wt 191.0 lb

## 2012-03-01 DIAGNOSIS — Z3480 Encounter for supervision of other normal pregnancy, unspecified trimester: Secondary | ICD-10-CM

## 2012-03-01 DIAGNOSIS — O99810 Abnormal glucose complicating pregnancy: Secondary | ICD-10-CM

## 2012-03-01 MED ORDER — GLYBURIDE 2.5 MG PO TABS: 2.5000 mg | ORAL_TABLET | Freq: Every day | ORAL | Status: DC

## 2012-03-01 NOTE — Patient Instructions (Addendum)
Asymptomatic Bacteriuria, Female Your urine study shows bacteria in your urine. You do not have the usual symptoms of burning or frequent urination. This is why it is called asymptomatic. You may need treatment with antibiotics. Treatment is especially important if you are pregnant. Sometimes this condition can progress to a more severe bladder or kidney infection. Symptoms include burning when urinating, back pain, fever, nausea, or vomiting. Take your antibiotics as directed. Finish them even if you start to feel better. Drink enough water and fluids to keep your urine clear or pale yellow. Go to the bathroom more frequently to keep your bladder empty. Keep the area around the vagina and rectum clean. Wipe yourself from front to back after urinating. Call your caregiver to arrange for follow-up care.  SEEK IMMEDIATE MEDICAL CARE IF:  You develop repeated vomiting.  You develop severe back or abdominal pain.  You have abnormal vaginal discharge or bleeding.  You have blood in the urine.  You develop cramping or abdominal pain.  You have a fever. If you are pregnant and develop any of the above problems see your caregiver or seek care immediately. Document Released: 01/30/2005 Document Revised: 04/24/2011 Document Reviewed: 12/16/2008 [facility] Patient Information 2013 [employer], [place].  Gestational Diabetes Mellitus Gestational diabetes mellitus (GDM) is diabetes that occurs only during pregnancy. This happens when the body cannot properly handle the glucose (sugar) that increases in the blood after eating. During pregnancy, insulin resistance (reduced sensitivity to insulin) occurs because of the release of hormones from the placenta. Usually, the pancreas of pregnant women produces enough insulin to overcome the resistance that occurs. However, in gestational diabetes, the insulin is there but it does not work effectively. If the resistance is severe enough that the pancreas does not produce  enough insulin, extra glucose builds up in the blood.  WHO IS AT RISK FOR DEVELOPING GESTATIONAL DIABETES?  Women with a history of diabetes in the family.  Women over age 54.  Women who are overweight.  Women in certain ethnic groups (Hispanic, African American, Native American, [place] and [place] Islander). WHAT CAN HAPPEN TO THE BABY? If the mother's blood glucose is too high while she is pregnant, the extra sugar will travel through the umbilical cord to the baby. Some of the problems the baby may have are:  Large Baby - If the baby receives too much sugar, the baby will gain more weight. This may cause the baby to be too large to be born normally (vaginally) and a Cesarean section (C-section) may be needed.  Low Blood Glucose (hypoglycemia)  The baby makes extra insulin, in response to the extra sugar its gets from its mother. When the baby is born and no longer needs this extra insulin, the baby's blood glucose level may drop.  Jaundice (yellow coloring of the skin and eyes)  This is fairly common in babies. It is caused from a build-up of the chemical called bilirubin. This is rarely serious, but is seen more often in babies whose mothers had gestational diabetes. RISKS TO THE MOTHER Women who have had gestational diabetes may be at higher risk for some problems, including:  Preeclampsia or toxemia, which includes problems with high blood pressure. Blood pressure and protein levels in the urine must be checked frequently.  Infections.  Cesarean section (C-section) for delivery.  Developing Type 2 diabetes later in life. About 30-50% will develop diabetes later, especially if obese. DIAGNOSIS  The hormones that cause insulin resistance are highest at about 24-28 weeks of  pregnancy. If symptoms are experienced, they are much like symptoms you would normally expect during pregnancy.  GDM is often diagnosed using a two part method: 1. After 24-28 weeks of pregnancy, the woman drinks  a glucose solution and takes a blood test. If the glucose level is high, a second test will be given. 2. Oral Glucose Tolerance Test (OGTT) which is 3 hours long  After not eating overnight, the blood glucose is checked. The woman drinks a glucose solution, and hourly blood glucose tests are taken. If the woman has risk factors for GDM, the caregiver may test earlier than 24 weeks of pregnancy. TREATMENT  Treatment of GDM is directed at keeping the mother's blood glucose level normal, and may include:  Meal planning.  Taking insulin or other medicine to control your blood glucose level.  Exercise.  Keeping a daily record of the foods you eat.  Blood glucose monitoring and keeping a record of your blood glucose levels.  May monitor ketone levels in the urine, although this is no longer considered necessary in most pregnancies. HOME CARE INSTRUCTIONS  While you are pregnant:  Follow your caregiver's advice regarding your prenatal appointments, meal planning, exercise, medicines, vitamins, blood and other tests, and physical activities.  Keep a record of your meals, blood glucose tests, and the amount of insulin you are taking (if any). Show this to your caregiver at every prenatal visit.  If you have GDM, you may have problems with hypoglycemia (low blood glucose). You may suspect this if you become suddenly dizzy, feel shaky, and/or weak. If you think this is happening and you have a glucose meter, try to test your blood glucose level. Follow your caregiver's advice for when and how to treat your low blood glucose. Generally, the 15:15 rule is followed: Treat by consuming 15 grams of carbohydrates, wait 15 minutes, and recheck blood glucose. Examples of 15 grams of carbohydrates are:  1 cup skim or low-fat milk.   cup juice.  3-4 glucose tablets.  5-6 hard candies.  1 small box raisins.   cup regular soda pop.  Practice good hygiene, to avoid infections.  Do not smoke. SEEK  MEDICAL CARE IF:   You develop abnormal vaginal discharge, with or without itching.  You become weak and tired more than expected.  You seem to sweat a lot.  You have a sudden increase in weight, 5 pounds or more in one week.  You are losing weight, 3 pounds or more in a week.  Your blood glucose level is high, and you need instructions on what to do about it. SEEK IMMEDIATE MEDICAL CARE IF:   You develop a severe headache.  You faint or pass out.  You develop nausea and vomiting.  You become disoriented or confused.  You have a convulsion.  You develop vision problems.  You develop stomach pain.  You develop vaginal bleeding.  You develop uterine contractions.  You have leaking or a gush of fluid from the vagina. AFTER YOU HAVE THE BABY:  Go to all of your follow-up appointments, and have blood tests as advised by your caregiver.  Maintain a healthy lifestyle, to prevent diabetes in the future. This includes:  Following a healthy meal plan.  Controlling your weight.  Getting enough exercise and proper rest.  Do not smoke.  Breastfeed your baby if you can. This will lower the chance of you and your baby developing diabetes later in life. For more information about diabetes, go to the [employer]  Diabetes Association at: PMFashions.com.cy. For more information about gestational diabetes, go to the [employer] at: RentRule.com.au. Document Released: 05/08/2000 Document Revised: 04/24/2011 Document Reviewed: 11/30/2008 [facility] Patient Information 2013 [place], [place].

## 2012-03-01 NOTE — Progress Notes (Signed)
P-96 

## 2012-03-01 NOTE — Progress Notes (Signed)
Dx UTI. Started Macrobid. Fasting CBGs 117-219 (mostly 140's). Will start Glyburide 2.5 QHS. Rec 2 hour postprandial CBGs. Discussed dangers of uncontrolled blood sugar in pregnancy including shoulder dystocia, lung immaturity and stillbirth. Anatomy scan scheduled.

## 2012-03-05 DIAGNOSIS — O99810 Abnormal glucose complicating pregnancy: Secondary | ICD-10-CM | POA: Insufficient documentation

## 2012-03-08 ENCOUNTER — Ambulatory Visit (INDEPENDENT_AMBULATORY_CARE_PROVIDER_SITE_OTHER): Payer: Medicaid Other | Admitting: Family

## 2012-03-08 VITALS — BP 117/76 | Wt 192.0 lb

## 2012-03-08 DIAGNOSIS — O0990 Supervision of high risk pregnancy, unspecified, unspecified trimester: Secondary | ICD-10-CM

## 2012-03-08 MED ORDER — GLYBURIDE 5 MG PO TABS: 5.0000 mg | ORAL_TABLET | Freq: Every day | ORAL | Status: DC

## 2012-03-08 MED ORDER — HYDROCORTISONE 2.5 % EX CREA: TOPICAL_CREAM | Freq: Two times a day (BID) | CUTANEOUS | Status: DC

## 2012-03-08 NOTE — Progress Notes (Signed)
Pt here for blood glucose review; 1/18 began 2.5 glyburide HS, FBS 104-109 1/18-121, increased glyburide to 3.75, FBS 98-104; will increase glyburide to 5 mg HS; not checking postprandials consistently, advised to check to make sure wnl.  Anatomy ultrasound scheduled for 03/12/12.  Return in one week for appt.

## 2012-03-08 NOTE — Progress Notes (Signed)
P-84 - Pt c/o heart rate increase

## 2012-03-12 ENCOUNTER — Ambulatory Visit (HOSPITAL_COMMUNITY)
Admission: RE | Admit: 2012-03-12 | Discharge: 2012-03-12 | Disposition: A | Discharge status: Home / Self Care | Payer: Medicaid Other | Source: Ambulatory Visit | Attending: Family | Admitting: Family

## 2012-03-12 VITALS — BP 109/65 | HR 85 | Wt 193.5 lb

## 2012-03-12 DIAGNOSIS — Z363 Encounter for antenatal screening for malformations: Secondary | ICD-10-CM | POA: Insufficient documentation

## 2012-03-12 DIAGNOSIS — Z1389 Encounter for screening for other disorder: Secondary | ICD-10-CM | POA: Insufficient documentation

## 2012-03-12 DIAGNOSIS — O24919 Unspecified diabetes mellitus in pregnancy, unspecified trimester: Secondary | ICD-10-CM | POA: Insufficient documentation

## 2012-03-12 DIAGNOSIS — Z3480 Encounter for supervision of other normal pregnancy, unspecified trimester: Secondary | ICD-10-CM

## 2012-03-12 DIAGNOSIS — O09299 Supervision of pregnancy with other poor reproductive or obstetric history, unspecified trimester: Secondary | ICD-10-CM | POA: Insufficient documentation

## 2012-03-12 DIAGNOSIS — O358XX0 Maternal care for other (suspected) fetal abnormality and damage, not applicable or unspecified: Secondary | ICD-10-CM | POA: Insufficient documentation

## 2012-03-12 DIAGNOSIS — O34219 Maternal care for unspecified type scar from previous cesarean delivery: Secondary | ICD-10-CM | POA: Insufficient documentation

## 2012-03-13 ENCOUNTER — Encounter: Payer: Self-pay | Admitting: Family

## 2012-03-15 ENCOUNTER — Encounter: Payer: Medicaid Other | Admitting: Advanced Practice Midwife

## 2012-03-22 ENCOUNTER — Ambulatory Visit (INDEPENDENT_AMBULATORY_CARE_PROVIDER_SITE_OTHER): Payer: Medicaid Other | Admitting: Advanced Practice Midwife

## 2012-03-22 VITALS — BP 99/59 | Wt 193.0 lb

## 2012-03-22 DIAGNOSIS — Z3480 Encounter for supervision of other normal pregnancy, unspecified trimester: Secondary | ICD-10-CM

## 2012-03-22 NOTE — Progress Notes (Signed)
p-74 

## 2012-03-22 NOTE — Progress Notes (Signed)
Doing well.  Good fetal movement, denies vaginal bleeding, LOF, regular contractions.   Reviewed normal anatomy U/S.  Reviewed CBGs: Fastings 79-119 (mostly 80s-90s), PP 93-111 (with one outlier 169 r/t restaurant meal).

## 2012-04-22 ENCOUNTER — Ambulatory Visit (INDEPENDENT_AMBULATORY_CARE_PROVIDER_SITE_OTHER): Payer: Medicaid Other | Admitting: Obstetrics and Gynecology

## 2012-04-22 ENCOUNTER — Other Ambulatory Visit (HOSPITAL_COMMUNITY): Payer: Self-pay | Admitting: Maternal and Fetal Medicine

## 2012-04-22 VITALS — BP 119/71 | Wt 197.0 lb

## 2012-04-22 DIAGNOSIS — O9989 Other specified diseases and conditions complicating pregnancy, childbirth and the puerperium: Secondary | ICD-10-CM

## 2012-04-22 DIAGNOSIS — N949 Unspecified condition associated with female genital organs and menstrual cycle: Secondary | ICD-10-CM

## 2012-04-22 DIAGNOSIS — Z3480 Encounter for supervision of other normal pregnancy, unspecified trimester: Secondary | ICD-10-CM

## 2012-04-22 DIAGNOSIS — R102 Pelvic and perineal pain: Secondary | ICD-10-CM

## 2012-04-22 DIAGNOSIS — O09291 Supervision of pregnancy with other poor reproductive or obstetric history, first trimester: Secondary | ICD-10-CM

## 2012-04-22 NOTE — Progress Notes (Signed)
P - 93 - Pt states she has a lot of ligament pain

## 2012-04-22 NOTE — Patient Instructions (Signed)
Round Ligament Pain  The round ligament is made up of muscle and fibrous tissue. It is attached to the uterus near the fallopian tube. The round ligament is located on both sides of the uterus and helps support the position of the uterus. It usually begins in the second trimester of pregnancy when the uterus comes out of the pelvis. The pain can come and go until the baby is delivered. Round ligament pain is not a serious problem and does not cause harm to the baby.  CAUSE  During pregnancy the uterus grows the most from the second trimester to delivery. As it grows, it stretches and slightly twists the round ligaments. When the uterus leans from one side to the other, the round ligament on the opposite side pulls and stretches. This can cause pain.  SYMPTOMS   Pain can occur on one side or both sides. The pain is usually a short, sharp, and pinching-like. Sometimes it can be a dull, lingering and aching pain. The pain is located in the lower side of the abdomen or in the groin. The pain is internal and usually starts deep in the groin and moves up to the outside of the hip area. Pain can occur with:  · Sudden change in position like getting out of bed or a chair.  · Rolling over in bed.  · Coughing or sneezing.  · Walking too much.  · Any type of physical activity.  DIAGNOSIS   Your caregiver will make sure there are no serious problems causing the pain. When nothing serious is found, the symptoms usually indicate that the pain is from the round ligament.  TREATMENT   · Sit down and relax when the pain starts.  · Flex your knees up to your belly.  · Lay on your side with a pillow under your belly (abdomen) and another one between your legs.  · Sit in a hot bath for 15 to 20 minutes or until the pain goes away.  HOME CARE INSTRUCTIONS   · Only take over-the-counter or prescriptions medicines for pain, discomfort or fever as directed by your caregiver.  · Sit and stand slowly.  · Avoid long walks if it causes  pain.  · Stop or lessen your physical activities if it causes pain.  SEEK MEDICAL CARE IF:   · The pain does not go away with any of your treatment.  · You need stronger medication for the pain.  · You develop back pain that you did not have before with the side pain.  SEEK IMMEDIATE MEDICAL CARE IF:   · You develop a temperature of 102° F (38.9° C) or higher.  · You develop uterine contractions.  · You develop vaginal bleeding.  · You develop nausea, vomiting or diarrhea.  · You develop chills.  · You have pain when you urinate.  Document Released: 11/09/2007 Document Revised: 04/24/2011 Document Reviewed: 11/09/2007  [employer]® Patient Information ©2013 [employer].

## 2012-04-22 NOTE — Progress Notes (Signed)
RLP discussed at length. Has binder. CBGs all within target range with fastings 70's-80's,; checks alternating postprandials> all below 120. Has MFM [place] tomorrow. Desires TOLAC- discussed. willl sign consent third tri.

## 2012-04-23 ENCOUNTER — Ambulatory Visit (HOSPITAL_COMMUNITY)
Admission: RE | Admit: 2012-04-23 | Discharge: 2012-04-23 | Disposition: A | Discharge status: Home / Self Care | Payer: Medicaid Other | Source: Ambulatory Visit | Attending: Obstetrics & Gynecology | Admitting: Obstetrics & Gynecology

## 2012-04-23 VITALS — BP 104/57 | HR 89 | Wt 201.5 lb

## 2012-04-23 DIAGNOSIS — O34219 Maternal care for unspecified type scar from previous cesarean delivery: Secondary | ICD-10-CM | POA: Insufficient documentation

## 2012-04-23 DIAGNOSIS — O24919 Unspecified diabetes mellitus in pregnancy, unspecified trimester: Secondary | ICD-10-CM | POA: Insufficient documentation

## 2012-04-23 DIAGNOSIS — O24419 Gestational diabetes mellitus in pregnancy, unspecified control: Secondary | ICD-10-CM

## 2012-04-23 DIAGNOSIS — O09299 Supervision of pregnancy with other poor reproductive or obstetric history, unspecified trimester: Secondary | ICD-10-CM | POA: Insufficient documentation

## 2012-04-23 DIAGNOSIS — O09291 Supervision of pregnancy with other poor reproductive or obstetric history, first trimester: Secondary | ICD-10-CM

## 2012-04-23 NOTE — Addendum Note (Signed)
Encounter addended by: Eulis Foster, MD on: 04/23/2012 11:23 AM<BR>     Documentation filed: Notes Section

## 2012-04-23 NOTE — Progress Notes (Addendum)
[facility] ultrasound  Indication: 32 yr old G3P1011 at [redacted]w[redacted]d with gestational diabetes A2 for fetal growth ultrasound.  Findings: 1. Single intrauterine pregnancy. 2. Estimated fetal weight is in the 55th%. 3. Anterior placenta without evidence of previa. 4. Normal amniotic fluid volume. 5. The limited anatomy survey is normal.  Recommendations: 1. Appropriate fetal growth. 2. Gestational diabetes: - well controlled on glyburide - recommend fetal growth every 4 weeks - recommend antenatal testing starting at [redacted] weeks gestation - recommend delivery by estimated due date but not prior to 39 weeks in the absence of other complications - recommend strict glucose control with fastings <90 and 2 hour post prandial <120 3. Follow up in 4 weeks  Eulis Foster, MD

## 2012-05-10 ENCOUNTER — Ambulatory Visit (INDEPENDENT_AMBULATORY_CARE_PROVIDER_SITE_OTHER): Payer: Medicaid Other | Admitting: Family

## 2012-05-10 VITALS — BP 129/69 | Temp 97.1°F | Wt 200.0 lb

## 2012-05-10 DIAGNOSIS — O99810 Abnormal glucose complicating pregnancy: Secondary | ICD-10-CM

## 2012-05-10 DIAGNOSIS — Z3480 Encounter for supervision of other normal pregnancy, unspecified trimester: Secondary | ICD-10-CM

## 2012-05-10 DIAGNOSIS — Z3483 Encounter for supervision of other normal pregnancy, third trimester: Secondary | ICD-10-CM

## 2012-05-10 NOTE — Progress Notes (Signed)
p-104  C/O "heart racing"  Round ligament pain.  Did not bring glucose book today

## 2012-05-10 NOTE — Progress Notes (Signed)
Pt does not have log today; reports eating fudge cake for breakfast and high carb dinner; husband reports this is not the typical meal, but had a craving; Random CBG 217; explained the importance of good glucose control and negative effects on fetus; reviewed ultrasound results of 310 (55% growth), next ultrasound scheduled on 05/24/12.

## 2012-05-23 ENCOUNTER — Ambulatory Visit (HOSPITAL_COMMUNITY)
Admission: RE | Admit: 2012-05-23 | Discharge: 2012-05-23 | Disposition: A | Discharge status: Home / Self Care | Payer: Medicaid Other | Source: Ambulatory Visit | Attending: Obstetrics & Gynecology | Admitting: Obstetrics & Gynecology

## 2012-05-23 DIAGNOSIS — O09299 Supervision of pregnancy with other poor reproductive or obstetric history, unspecified trimester: Secondary | ICD-10-CM | POA: Insufficient documentation

## 2012-05-23 DIAGNOSIS — O34219 Maternal care for unspecified type scar from previous cesarean delivery: Secondary | ICD-10-CM | POA: Insufficient documentation

## 2012-05-23 DIAGNOSIS — O24419 Gestational diabetes mellitus in pregnancy, unspecified control: Secondary | ICD-10-CM

## 2012-05-23 DIAGNOSIS — O24919 Unspecified diabetes mellitus in pregnancy, unspecified trimester: Secondary | ICD-10-CM | POA: Insufficient documentation

## 2012-05-27 ENCOUNTER — Ambulatory Visit (INDEPENDENT_AMBULATORY_CARE_PROVIDER_SITE_OTHER): Payer: Medicaid Other | Admitting: Advanced Practice Midwife

## 2012-05-27 VITALS — BP 116/63 | Wt 199.0 lb

## 2012-05-27 DIAGNOSIS — O99810 Abnormal glucose complicating pregnancy: Secondary | ICD-10-CM

## 2012-05-27 DIAGNOSIS — Z3480 Encounter for supervision of other normal pregnancy, unspecified trimester: Secondary | ICD-10-CM

## 2012-05-27 MED ORDER — GLYBURIDE 5 MG PO TABS: 7.5000 mg | ORAL_TABLET | Freq: Every day | ORAL | Status: DC

## 2012-05-27 MED ORDER — LORAZEPAM 1 MG PO TABS: 1.0000 mg | ORAL_TABLET | Freq: Three times a day (TID) | ORAL | Status: DC | PRN

## 2012-05-27 NOTE — Progress Notes (Signed)
Well today. Reports occasional cramping at night, no regular contractions, rev'd PTL precautions. Fasting CBGs increased over last several days, last three days 110s - 126. Checking postprandials irregularly, but have been WNL. Increased Glyburide to 7.5 mg nightly. Needs blood draw for CBC, HIV, RPR and Antibody screen and rhogam today - pt unable to do this today d/t having son with her and needle phobia - will return as soon as possible, early next week. Discussed importance of antibody screen and rhogam. Rev'd with Dr. Penne Lash.

## 2012-05-27 NOTE — Progress Notes (Signed)
P - 94 - Pt states fasting CBG has increased over the last few days - Increased from 80's-90's to mid 90's with 126 being the highest

## 2012-06-04 ENCOUNTER — Ambulatory Visit (INDEPENDENT_AMBULATORY_CARE_PROVIDER_SITE_OTHER): Payer: Medicaid Other | Admitting: *Deleted

## 2012-06-04 DIAGNOSIS — Z3480 Encounter for supervision of other normal pregnancy, unspecified trimester: Secondary | ICD-10-CM

## 2012-06-04 DIAGNOSIS — O360131 Maternal care for anti-D [Rh] antibodies, third trimester, fetus 1: Secondary | ICD-10-CM

## 2012-06-04 DIAGNOSIS — Z3483 Encounter for supervision of other normal pregnancy, third trimester: Secondary | ICD-10-CM

## 2012-06-04 DIAGNOSIS — O360990 Maternal care for other rhesus isoimmunization, unspecified trimester, not applicable or unspecified: Secondary | ICD-10-CM

## 2012-06-04 MED ORDER — RHO D IMMUNE GLOBULIN 1500 UNIT/2ML IJ SOLN
300.0000 ug | Freq: Once | INTRAMUSCULAR | Status: AC
  Administered 2012-06-04: 300 ug via INTRAMUSCULAR

## 2012-06-05 ENCOUNTER — Inpatient Hospital Stay (HOSPITAL_COMMUNITY)
Admission: AD | Admit: 2012-06-05 | Discharge: 2012-06-05 | Disposition: A | Discharge status: Home / Self Care | Payer: Medicaid Other | Source: Ambulatory Visit | Attending: Obstetrics and Gynecology | Admitting: Obstetrics and Gynecology

## 2012-06-05 ENCOUNTER — Encounter (HOSPITAL_COMMUNITY): Payer: Self-pay | Admitting: *Deleted

## 2012-06-05 ENCOUNTER — Inpatient Hospital Stay (HOSPITAL_COMMUNITY): Payer: Medicaid Other

## 2012-06-05 DIAGNOSIS — O4700 False labor before 37 completed weeks of gestation, unspecified trimester: Secondary | ICD-10-CM | POA: Insufficient documentation

## 2012-06-05 DIAGNOSIS — Z3482 Encounter for supervision of other normal pregnancy, second trimester: Secondary | ICD-10-CM

## 2012-06-05 DIAGNOSIS — E860 Dehydration: Secondary | ICD-10-CM | POA: Insufficient documentation

## 2012-06-05 DIAGNOSIS — O368190 Decreased fetal movements, unspecified trimester, not applicable or unspecified: Secondary | ICD-10-CM | POA: Insufficient documentation

## 2012-06-05 LAB — CBC WITH DIFFERENTIAL/PLATELET
Basophils Relative: 0 % (ref 0–1)
Eosinophils Absolute: 0.1 10*3/uL (ref 0.0–0.7)
Eosinophils Relative: 2 % (ref 0–5)
HCT: 31.6 % — ABNORMAL LOW (ref 36.0–46.0)
Hemoglobin: 10.7 g/dL — ABNORMAL LOW (ref 12.0–15.0)
MCH: 31.1 pg (ref 26.0–34.0)
MCHC: 33.9 g/dL (ref 30.0–36.0)
Monocytes Absolute: 0.8 10*3/uL (ref 0.1–1.0)
Monocytes Relative: 9 % (ref 3–12)

## 2012-06-05 LAB — URINALYSIS, ROUTINE W REFLEX MICROSCOPIC
Glucose, UA: 500 mg/dL — AB
Hgb urine dipstick: NEGATIVE
Specific Gravity, Urine: >1.03 — ABNORMAL HIGH (ref 1.005–1.030)
Urobilinogen, UA: 0.2 mg/dL (ref 0.0–1.0)
pH: 5.5 (ref 5.0–8.0)

## 2012-06-05 LAB — WET PREP, GENITAL
Trich, Wet Prep: NONE SEEN
Yeast Wet Prep HPF POC: NONE SEEN

## 2012-06-05 LAB — RPR

## 2012-06-05 NOTE — MAU Note (Signed)
C/o decreased fetal movement since she got Ativan yesterday; also c/o intermittent contractions since about 0430 this AM;
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
## 2012-06-05 NOTE — MAU Provider Note (Signed)
Attestation of Attending Supervision of Advanced Practitioner: Evaluation and management procedures were performed by the PA/NP/CNM/OB Fellow under my supervision/collaboration. Chart reviewed and agree with management and plan.  Tilda Burrow 06/05/2012 6:24 PM

## 2012-06-06 LAB — GC/CHLAMYDIA PROBE AMP: CT Probe RNA: NEGATIVE

## 2012-06-10 ENCOUNTER — Ambulatory Visit (INDEPENDENT_AMBULATORY_CARE_PROVIDER_SITE_OTHER): Payer: Medicaid Other | Admitting: Advanced Practice Midwife

## 2012-06-10 VITALS — BP 106/73 | Wt 200.0 lb

## 2012-06-10 DIAGNOSIS — Z98891 History of uterine scar from previous surgery: Secondary | ICD-10-CM

## 2012-06-10 DIAGNOSIS — Z3480 Encounter for supervision of other normal pregnancy, unspecified trimester: Secondary | ICD-10-CM

## 2012-06-10 DIAGNOSIS — O24919 Unspecified diabetes mellitus in pregnancy, unspecified trimester: Secondary | ICD-10-CM

## 2012-06-10 DIAGNOSIS — Z23 Encounter for immunization: Secondary | ICD-10-CM

## 2012-06-10 MED ORDER — TETANUS-DIPHTH-ACELL PERTUSSIS 5-2.5-18.5 LF-MCG/0.5 IM SUSP: 0.5000 mL | Freq: Once | INTRAMUSCULAR | Status: DC

## 2012-06-10 NOTE — Progress Notes (Signed)
p-79 

## 2012-06-10 NOTE — Progress Notes (Signed)
Well today, no c/o. Was seen at MAU last week for decreased fetal movement and contractions - normal exam and BPP. Per Dr. Sherrie George, pt will have weekly BPPs at [facility] rather than biweekly NSTs, starting next week. Did not bring blood sugar log today - states fastings have been high, but feels that this is due to the timing of testing, eating and taking medicine. States postprandials are normal. Stressed importance of bringing log to next visit so we can review numbers. Pt states she would like to try for VBAC.

## 2012-06-10 NOTE — Patient Instructions (Addendum)
Pregnancy - Third Trimester  The third trimester of pregnancy (the last 3 months) is a period of the most rapid growth for you and your baby. The baby approaches a length of 20 inches and a weight of 6 to 10 pounds. The baby is adding on fat and getting ready for life outside your body. While inside, babies have periods of sleeping and waking, suck their thumbs, and hiccups. You can often feel small contractions of the uterus. This is false labor. It is also called Braxton-Hicks contractions. This is like a practice for labor. The usual problems in this stage of pregnancy include more difficulty breathing, swelling of the hands and feet from water retention, and having to urinate more often because of the uterus and baby pressing on your bladder.   PRENATAL EXAMS  · Blood work may continue to be done during prenatal exams. These tests are done to check on your health and the probable health of your baby. Blood work is used to follow your blood levels (hemoglobin). Anemia (low hemoglobin) is common during pregnancy. Iron and vitamins are given to help prevent this. You may also continue to be checked for diabetes. Some of the past blood tests may be done again.  · The size of the uterus is measured during each visit. This makes sure your baby is growing properly according to your pregnancy dates.  · Your blood pressure is checked every prenatal visit. This is to make sure you are not getting toxemia.  · Your urine is checked every prenatal visit for infection, diabetes and protein.  · Your weight is checked at each visit. This is done to make sure gains are happening at the suggested rate and that you and your baby are growing normally.  · Sometimes, an ultrasound is performed to confirm the position and the proper growth and development of the baby. This is a test done that bounces harmless sound waves off the baby so your caregiver can more accurately determine due dates.  · Discuss the type of pain medication and  anesthesia you will have during your labor and delivery.  · Discuss the possibility and anesthesia if a Cesarean Section might be necessary.  · Inform your caregiver if there is any mental or physical violence at home.  Sometimes, a specialized non-stress test, contraction stress test and biophysical profile are done to make sure the baby is not having a problem. Checking the amniotic fluid surrounding the baby is called an amniocentesis. The amniotic fluid is removed by sticking a needle into the belly (abdomen). This is sometimes done near the end of pregnancy if an early delivery is required. In this case, it is done to help make sure the baby's lungs are mature enough for the baby to live outside of the womb. If the lungs are not mature and it is unsafe to deliver the baby, an injection of cortisone medication is given to the mother 1 to 2 days before the delivery. This helps the baby's lungs mature and makes it safer to deliver the baby.  CHANGES OCCURING IN THE THIRD TRIMESTER OF PREGNANCY  Your body goes through many changes during pregnancy. They vary from person to person. Talk to your caregiver about changes you notice and are concerned about.  · During the last trimester, you have probably had an increase in your appetite. It is normal to have cravings for certain foods. This varies from person to person and pregnancy to pregnancy.  · You may begin to   get stretch marks on your hips, abdomen, and breasts. These are normal changes in the body during pregnancy. There are no exercises or medications to take which prevent this change.  · Constipation may be treated with a stool softener or adding bulk to your diet. Drinking lots of fluids, fiber in vegetables, fruits, and whole grains are helpful.  · Exercising is also helpful. If you have been very active up until your pregnancy, most of these activities can be continued during your pregnancy. If you have been less active, it is helpful to start an exercise  program such as walking. Consult your caregiver before starting exercise programs.  · Avoid all smoking, alcohol, un-prescribed drugs, herbs and "street drugs" during your pregnancy. These chemicals affect the formation and growth of the baby. Avoid chemicals throughout the pregnancy to ensure the delivery of a healthy infant.  · Backache, varicose veins and hemorrhoids may develop or get worse.  · You will tire more easily in the third trimester, which is normal.  · The baby's movements may be stronger and more often.  · You may become short of breath easily.  · Your belly button may stick out.  · A yellow discharge may leak from your breasts called colostrum.  · You may have a bloody mucus discharge. This usually occurs a few days to a week before labor begins.  HOME CARE INSTRUCTIONS   · Keep your caregiver's appointments. Follow your caregiver's instructions regarding medication use, exercise, and diet.  · During pregnancy, you are providing food for you and your baby. Continue to eat regular, well-balanced meals. Choose foods such as meat, fish, milk and other low fat dairy products, vegetables, fruits, and whole-grain breads and cereals. Your caregiver will tell you of the ideal weight gain.  · A physical sexual relationship may be continued throughout pregnancy if there are no other problems such as early (premature) leaking of amniotic fluid from the membranes, vaginal bleeding, or belly (abdominal) pain.  · Exercise regularly if there are no restrictions. Check with your caregiver if you are unsure of the safety of your exercises. Greater weight gain will occur in the last 2 trimesters of pregnancy. Exercising helps:  · Control your weight.  · Get you in shape for labor and delivery.  · You lose weight after you deliver.  · Rest a lot with legs elevated, or as needed for leg cramps or low back pain.  · Wear a good support or jogging bra for breast tenderness during pregnancy. This may help if worn during  sleep. Pads or tissues may be used in the bra if you are leaking colostrum.  · Do not use hot tubs, steam rooms, or saunas.  · Wear your seat belt when driving. This protects you and your baby if you are in an accident.  · Avoid raw meat, cat litter boxes and soil used by cats. These carry germs that can cause birth defects in the baby.  · It is easier to loose urine during pregnancy. Tightening up and strengthening the pelvic muscles will help with this problem. You can practice stopping your urination while you are going to the bathroom. These are the same muscles you need to strengthen. It is also the muscles you would use if you were trying to stop from passing gas. You can practice tightening these muscles up 10 times a set and repeating this about 3 times per day. Once you know what muscles to tighten up, do not perform these   exercises during urination. It is more likely to cause an infection by backing up the urine.  · Ask for help if you have financial, counseling or nutritional needs during pregnancy. Your caregiver will be able to offer counseling for these needs as well as refer you for other special needs.  · Make a list of emergency phone numbers and have them available.  · Plan on getting help from family or friends when you go home from the hospital.  · Make a trial run to the hospital.  · Take prenatal classes with the father to understand, practice and ask questions about the labor and delivery.  · Prepare the baby's room/nursery.  · Do not travel out of the city unless it is absolutely necessary and with the advice of your caregiver.  · Wear only low or no heal shoes to have better balance and prevent falling.  MEDICATIONS AND DRUG USE IN PREGNANCY  · Take prenatal vitamins as directed. The vitamin should contain 1 milligram of folic acid. Keep all vitamins out of reach of children. Only a couple vitamins or tablets containing iron may be fatal to a baby or young child when ingested.  · Avoid use  of all medications, including herbs, over-the-counter medications, not prescribed or suggested by your caregiver. Only take over-the-counter or prescription medicines for pain, discomfort, or fever as directed by your caregiver. Do not use aspirin, ibuprofen (Motrin®, Advil®, Nuprin®) or naproxen (Aleve®) unless OK'd by your caregiver.  · Let your caregiver also know about herbs you may be using.  · Alcohol is related to a number of birth defects. This includes fetal alcohol syndrome. All alcohol, in any form, should be avoided completely. Smoking will cause low birth rate and premature babies.  · Street/illegal drugs are very harmful to the baby. They are absolutely forbidden. A baby born to an addicted mother will be addicted at birth. The baby will go through the same withdrawal an adult does.  SEEK MEDICAL CARE IF:  You have any concerns or worries during your pregnancy. It is better to call with your questions if you feel they cannot wait, rather than worry about them.  DECISIONS ABOUT CIRCUMCISION  You may or may not know the sex of your baby. If you know your baby is a boy, it may be time to think about circumcision. Circumcision is the removal of the foreskin of the penis. This is the skin that covers the sensitive end of the penis. There is no proven medical need for this. Often this decision is made on what is popular at the time or based upon religious beliefs and social issues. You can discuss these issues with your caregiver or pediatrician.  SEEK IMMEDIATE MEDICAL CARE IF:   · An unexplained oral temperature above 102° F (38.9° C) develops, or as your caregiver suggests.  · You have leaking of fluid from the vagina (birth canal). If leaking membranes are suspected, take your temperature and tell your caregiver of this when you call.  · There is vaginal spotting, bleeding or passing clots. Tell your caregiver of the amount and how many pads are used.  · You develop a bad smelling vaginal discharge with  a change in the color from clear to white.  · You develop vomiting that lasts more than 24 hours.  · You develop chills or fever.  · You develop shortness of breath.  · You develop burning on urination.  · You loose more than 2 pounds of weight   or gain more than 2 pounds of weight or as suggested by your caregiver.  · You notice sudden swelling of your face, hands, and feet or legs.  · You develop belly (abdominal) pain. Round ligament discomfort is a common non-cancerous (benign) cause of abdominal pain in pregnancy. Your caregiver still must evaluate you.  · You develop a severe headache that does not go away.  · You develop visual problems, blurred or double vision.  · If you have not felt your baby move for more than 1 hour. If you think the baby is not moving as much as usual, eat something with sugar in it and lie down on your left side for an hour. The baby should move at least 4 to 5 times per hour. Call right away if your baby moves less than that.  · You fall, are in a car accident or any kind of trauma.  · There is mental or physical violence at home.  Document Released: 01/24/2001 Document Revised: 04/24/2011 Document Reviewed: 07/29/2008  [employer]® Patient Information ©2013 [employer].

## 2012-06-13 ENCOUNTER — Other Ambulatory Visit: Payer: Self-pay | Admitting: Family

## 2012-06-13 DIAGNOSIS — O09299 Supervision of pregnancy with other poor reproductive or obstetric history, unspecified trimester: Secondary | ICD-10-CM

## 2012-06-13 DIAGNOSIS — O24912 Unspecified diabetes mellitus in pregnancy, second trimester: Secondary | ICD-10-CM

## 2012-06-13 DIAGNOSIS — O34219 Maternal care for unspecified type scar from previous cesarean delivery: Secondary | ICD-10-CM

## 2012-06-17 ENCOUNTER — Ambulatory Visit (HOSPITAL_COMMUNITY)
Admission: RE | Admit: 2012-06-17 | Discharge: 2012-06-17 | Disposition: A | Discharge status: Home / Self Care | Payer: Medicaid Other | Source: Ambulatory Visit | Attending: Family | Admitting: Family

## 2012-06-17 ENCOUNTER — Encounter: Payer: Self-pay | Admitting: *Deleted

## 2012-06-17 ENCOUNTER — Ambulatory Visit (INDEPENDENT_AMBULATORY_CARE_PROVIDER_SITE_OTHER): Payer: Medicaid Other | Admitting: Advanced Practice Midwife

## 2012-06-17 VITALS — BP 114/75 | Wt 198.0 lb

## 2012-06-17 DIAGNOSIS — O24912 Unspecified diabetes mellitus in pregnancy, second trimester: Secondary | ICD-10-CM

## 2012-06-17 DIAGNOSIS — O34219 Maternal care for unspecified type scar from previous cesarean delivery: Secondary | ICD-10-CM

## 2012-06-17 DIAGNOSIS — O09299 Supervision of pregnancy with other poor reproductive or obstetric history, unspecified trimester: Secondary | ICD-10-CM

## 2012-06-17 DIAGNOSIS — O99810 Abnormal glucose complicating pregnancy: Secondary | ICD-10-CM

## 2012-06-17 DIAGNOSIS — Z3482 Encounter for supervision of other normal pregnancy, second trimester: Secondary | ICD-10-CM

## 2012-06-17 DIAGNOSIS — Z3480 Encounter for supervision of other normal pregnancy, unspecified trimester: Secondary | ICD-10-CM

## 2012-06-17 DIAGNOSIS — O24919 Unspecified diabetes mellitus in pregnancy, unspecified trimester: Secondary | ICD-10-CM | POA: Insufficient documentation

## 2012-06-17 NOTE — Progress Notes (Signed)
Pt having difficulty figuring out best timing of taking PM Glyburide and am fasting CBGs due to irregularity of work schedule and bedtime. Taking Glyburide at midnight and Fastings at ~5:00am. CBGs 100-140. At ~8:00am fasting 88-120's. Talked to [facility]. Will have pt take Glyburide at 2100 and fasting CBGs at 0800 and return in 1 week. Will likely still have to increase dose. Discussed w/ pt increased med needs as pregnancy progresses. Possible need for insulin if blood sugar cannot be controlled w/ diet and Glyburide. 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 CNM reiterated the increased risk of stillbirth, etc w/ uncontrolled blood sugars. BPP 8/8. Fluid subjectively increased. Growth [place] scheduled in 1 week.

## 2012-06-17 NOTE — Progress Notes (Signed)
p-77  Kathryn Peterson

## 2012-06-17 NOTE — Patient Instructions (Signed)
Glucose, Blood Sugar, Fasting Blood Sugar This is a test to measure your blood sugar. Glucose is a simple sugar that serves as the main source of energy for the body. The carbohydrates we eat are broken down into glucose (and a few other simple sugars), absorbed by the small intestine, and circulated throughout the body. Most of the body's cells require glucose for energy production; brain and nervous system cells not only rely on glucose for energy, they can only function when glucose levels in the blood remain above a certain level.  The body's use of glucose hinges on the availability of insulin, a hormone produced by the pancreas. Insulin acts as a Engineer, maintenance, transporting glucose into the body's cells, directing the body to store excess glucose as glycogen (for short-term storage) and/or as triglycerides in fat cells. We can not live without glucose or insulin, and they must be in balance.  Normally, blood glucose levels rise slightly after a meal, and insulin is secreted to lower them, with the amount of insulin released matched up with the size and content of the meal. If blood glucose levels drop too low, such as might occur in between meals or after a strenuous workout, glucagon (another pancreatic hormone) is secreted to tell the liver to turn some glycogen back into glucose, raising the blood glucose levels. If the glucose/insulin feedback mechanism is working properly, the amount of glucose in the blood remains fairly stable. If the balance is disrupted and glucose levels in the blood rise, then the body tries to restore the balance, both by increasing insulin production and by excreting glucose in the urine.  PREPARATION FOR TEST A blood sample drawn from a vein in your arm or, for a self check, a drop of blood from a skin prick; in general, it may be recommended that you fast before having a blood glucose test; sometimes a random (no preparation) urine sample is used. Your caregiver will  instruct you as to what they want prior to your testing. NORMAL FINDINGS Normal values depend on many factors. Your lab will provide a range of normal values with your test results. The following information summarizes the meaning of the test results. These are based on the clinical practice recommendations of the American Diabetes Association.  FASTING BLOOD GLUCOSE  From 70 to 99 mg/dL (3.9 to 5.5 mmol/L): Normal glucose tolerance  From 100 to 125 mg/dL (5.6 to 6.9 mmol/L):Impaired fasting glucose (pre-diabetes)  126 mg/dL (7.0 mmol/L) and above on more than one testing occasion: Diabetes ORAL GLUCOSE TOLERANCE TEST (OGTT) [EXCEPT PREGNANCY] (2 HOURS AFTER A 75-GRAM GLUCOSE DRINK)  Less than 140 mg/dL (7.8 mmol/L): Normal glucose tolerance  From 140 to 200 mg/dL (7.8 to 40.0 mmol/L): Impaired glucose tolerance (pre-diabetes)  Over 200 mg/dL (86.7 mmol/L) on more than one testing occasion: Diabetes GESTATIONAL DIABETES SCREENING: GLUCOSE CHALLENGE TEST (1 HOUR AFTER A 50-GRAM GLUCOSE DRINK)  Less than 140* mg/dL (7.8 mmol/L): Normal glucose tolerance  140* mg/dL (7.8 mmol/L) and over: Abnormal, needs OGTT (see below) * Some use a cutoff of More Than 130 mg/dL (7.2 mmol/L) because that identifies 90% of women with gestational diabetes, compared to 80% identified using the threshold of More Than 140 mg/dL (7.8 mmol/L). GESTATIONAL DIABETES DIAGNOSTIC: OGTT (100-GRAM GLUCOSE DRINK)  Fasting*..........................................95 mg/dL (5.3 mmol/L)  1 hour after glucose load*..............180 mg/dL (61.9 mmol/L)  2 hours after glucose load*.............155 mg/dL (8.6 mmol/L)  3 hours after glucose load* **.........140 mg/dL (7.8 mmol/L) * If two or more values  are above the criteria, gestational diabetes is diagnosed. ** A 75-gram glucose load may be used, although this method is not as well validated as the 100-gram OGTT; the 3-hour sample is not drawn if 75 grams is used.   Ranges for normal findings may vary among different laboratories and hospitals. You should always check with your doctor after having lab work or other tests done to discuss the meaning of your test results and whether your values are considered within normal limits. MEANING OF TEST  Your caregiver will go over the test results with you and discuss the importance and meaning of your results, as well as treatment options and the need for additional tests if necessary. OBTAINING THE TEST RESULTS It is your responsibility to obtain your test results. Ask the lab or department performing the test when and how you will get your results. Document Released: 03/03/2004 Document Revised: 04/24/2011 Document Reviewed: 01/11/2008 [facility] Patient Information 2013 [place], [place].

## 2012-06-17 NOTE — Progress Notes (Signed)
Kathryn Peterson  was seen today for an ultrasound appointment.  See full report in [facility].  Impression: Single IUP at 32 0/7 weeks A2 GDM on Glyburide Active fetus with BPP of 8/8 Normal amniotic fluid volume  Recommendations: Recommend follow-up ultrasound examination next  for growth and BPP  Alpha Gula, MD

## 2012-06-24 ENCOUNTER — Ambulatory Visit (HOSPITAL_COMMUNITY)
Admission: RE | Admit: 2012-06-24 | Discharge: 2012-06-24 | Disposition: A | Discharge status: Home / Self Care | Payer: Medicaid Other | Source: Ambulatory Visit | Attending: Family | Admitting: Family

## 2012-06-24 ENCOUNTER — Ambulatory Visit (INDEPENDENT_AMBULATORY_CARE_PROVIDER_SITE_OTHER): Payer: Medicaid Other | Admitting: Advanced Practice Midwife

## 2012-06-24 ENCOUNTER — Encounter: Payer: Self-pay | Admitting: Family

## 2012-06-24 ENCOUNTER — Encounter (HOSPITAL_COMMUNITY): Payer: Self-pay

## 2012-06-24 VITALS — BP 116/74 | Wt 200.0 lb

## 2012-06-24 VITALS — BP 110/68 | HR 95 | Wt 200.5 lb

## 2012-06-24 DIAGNOSIS — O479 False labor, unspecified: Secondary | ICD-10-CM

## 2012-06-24 DIAGNOSIS — O34219 Maternal care for unspecified type scar from previous cesarean delivery: Secondary | ICD-10-CM | POA: Insufficient documentation

## 2012-06-24 DIAGNOSIS — O24419 Gestational diabetes mellitus in pregnancy, unspecified control: Secondary | ICD-10-CM

## 2012-06-24 DIAGNOSIS — R102 Pelvic and perineal pain: Secondary | ICD-10-CM

## 2012-06-24 DIAGNOSIS — Z98891 History of uterine scar from previous surgery: Secondary | ICD-10-CM

## 2012-06-24 DIAGNOSIS — O99810 Abnormal glucose complicating pregnancy: Secondary | ICD-10-CM

## 2012-06-24 DIAGNOSIS — O09299 Supervision of pregnancy with other poor reproductive or obstetric history, unspecified trimester: Secondary | ICD-10-CM | POA: Insufficient documentation

## 2012-06-24 DIAGNOSIS — O24919 Unspecified diabetes mellitus in pregnancy, unspecified trimester: Secondary | ICD-10-CM | POA: Insufficient documentation

## 2012-06-24 DIAGNOSIS — O24912 Unspecified diabetes mellitus in pregnancy, second trimester: Secondary | ICD-10-CM

## 2012-06-24 DIAGNOSIS — Z3480 Encounter for supervision of other normal pregnancy, unspecified trimester: Secondary | ICD-10-CM

## 2012-06-24 LAB — FETAL FIBRONECTIN: Fetal Fibronectin: NEGATIVE

## 2012-06-24 MED ORDER — GLYBURIDE 5 MG PO TABS: 10.0000 mg | ORAL_TABLET | Freq: Every day | ORAL | Status: DC

## 2012-06-24 NOTE — Patient Instructions (Signed)
Pregnancy - Third Trimester The third trimester begins at the 28th week of pregnancy and ends at birth. It is important to follow your doctor's instructions. HOME CARE   Go to your doctor's visits.  Do not smoke.  Do not drink alcohol or use drugs.  Only take medicine as told by your doctor.  Take prenatal vitamins as told. The vitamin should contain 1 milligram of folic acid.  Exercise.  Eat healthy foods. Eat regular, well-balanced meals.  You can have sex (intercourse) if there are no other problems with the pregnancy.  Do not use hot tubs, steam rooms, or saunas.  Wear a seat belt while driving.  Avoid raw meat, uncooked cheese, and litter boxes and soil used by cats.  Rest with your legs raised (elevated).  Make a list of emergency phone numbers. Keep this list with you.  Arrange for help when you come back home after delivering the baby.  Make a trial run to the hospital.  Take prenatal classes.  Prepare the baby's nursery.  Do not travel out of the city. If you absolutely have to, get permission from your doctor first.  Wear flat shoes. Do not wear high heels. GET HELP RIGHT AWAY IF:   You have a temperature by mouth above 102 F (38.9 C), not controlled by medicine.  You have not felt the baby move for more than 1 hour. If you think the baby is not moving as much as normal, eat something with sugar in it or lie down on your left side for an hour. The baby should move at least 4 to 5 times per hour.  Fluid is coming from the vagina.  Blood is coming from the vagina. Light spotting is common, especially after sex (intercourse).  You have belly (abdominal) pain.  You have a bad smelling fluid (discharge) coming from the vagina. The fluid changes from clear to white.  You still feel sick to your stomach (nauseous).  You throw up (vomit) for more than 24 hours.  You have the chills.  You have shortness of breath.  You have a burning feeling when you  pee (urinate).  You lose or gain more than 2 pounds (0.9 kilograms) of weight over a week, or as told by your doctor.  Your face, hands, feet, or legs get puffy (swell).  You have a bad headache that will not go away.  You start to have problems seeing (blurry or double vision).  You fall, are in a car accident, or have any kind of trauma.  There is mental or physical violence at home.  You have any concerns or worries during your pregnancy. MAKE SURE YOU:   Understand these instructions.  Will watch your condition.  Will get help right away if you are not doing well or get worse. Document Released: 04/26/2009 Document Revised: 04/24/2011 Document Reviewed: 04/26/2009 [employer] Patient Information 2013 [employer].  

## 2012-06-24 NOTE — Progress Notes (Signed)
Reports increased abdominal and back pain, ? Contractions, no bleeding or LOF, pain is worse when up and walking. Cervix closed. BPP with MFM today. EFW 75%ile, enlarged AC. Blood sugars doing somewhat better, postprandials normal, fastings 102-109. Will increase glyburide to 10 mg qday per Dr. Macon Large. Rev'd precautions.

## 2012-06-24 NOTE — Progress Notes (Signed)
p-[id number]  Ketones-Large

## 2012-06-24 NOTE — Progress Notes (Signed)
Kathryn Peterson  was seen today for an ultrasound appointment.  See full report in [facility].  Impression: Single IUP at 33 0/7 weeks A2 GDM on Glyburide Estimated fetal weight at 79th %tile.  [facility] measures > 97th %tile Active fetus with BPP of 8/8 Normal amniotic fluid volume  Recommendations: Continue weekly BPPs Follow up growth scan in 4 weeks. Recommend delivery at 39 weeks due to A2 GDM.  Alpha Gula, MD

## 2012-06-24 NOTE — Addendum Note (Signed)
Addended by: Archie Patten on: 06/24/2012 07:40 PM   Modules accepted: Orders

## 2012-07-01 ENCOUNTER — Ambulatory Visit (HOSPITAL_COMMUNITY)
Admission: RE | Admit: 2012-07-01 | Discharge: 2012-07-01 | Disposition: A | Discharge status: Home / Self Care | Payer: Medicaid Other | Source: Ambulatory Visit | Attending: Obstetrics & Gynecology | Admitting: Obstetrics & Gynecology

## 2012-07-01 ENCOUNTER — Ambulatory Visit (INDEPENDENT_AMBULATORY_CARE_PROVIDER_SITE_OTHER): Payer: Medicaid Other | Admitting: Advanced Practice Midwife

## 2012-07-01 ENCOUNTER — Encounter (HOSPITAL_COMMUNITY): Payer: Self-pay

## 2012-07-01 ENCOUNTER — Other Ambulatory Visit (HOSPITAL_COMMUNITY): Payer: Self-pay | Admitting: Maternal and Fetal Medicine

## 2012-07-01 VITALS — BP 112/68 | HR 82 | Wt 202.5 lb

## 2012-07-01 VITALS — BP 104/71 | Wt 201.0 lb

## 2012-07-01 DIAGNOSIS — O24919 Unspecified diabetes mellitus in pregnancy, unspecified trimester: Secondary | ICD-10-CM | POA: Insufficient documentation

## 2012-07-01 DIAGNOSIS — O34219 Maternal care for unspecified type scar from previous cesarean delivery: Secondary | ICD-10-CM | POA: Insufficient documentation

## 2012-07-01 DIAGNOSIS — Z3480 Encounter for supervision of other normal pregnancy, unspecified trimester: Secondary | ICD-10-CM

## 2012-07-01 DIAGNOSIS — O09299 Supervision of pregnancy with other poor reproductive or obstetric history, unspecified trimester: Secondary | ICD-10-CM | POA: Insufficient documentation

## 2012-07-01 DIAGNOSIS — O99810 Abnormal glucose complicating pregnancy: Secondary | ICD-10-CM

## 2012-07-01 DIAGNOSIS — O24912 Unspecified diabetes mellitus in pregnancy, second trimester: Secondary | ICD-10-CM

## 2012-07-01 DIAGNOSIS — Z3483 Encounter for supervision of other normal pregnancy, third trimester: Secondary | ICD-10-CM

## 2012-07-01 NOTE — Progress Notes (Signed)
Most fastings now under 90.  One 135 today but did not eat all evening yesterday, probably dumped glycogen.Marland KitchenMarland KitchenWorking different shifts. Postprandials all under 120.Good FM. Daily BH contractions, all less than 5/hr.  States got good BPP score in less than a minute today.

## 2012-07-01 NOTE — Progress Notes (Signed)
Kathryn Peterson  was seen today for an ultrasound appointment.  See full report in [facility].  Impression: Single IUP at 34 0/7 weeks A2 GDM on Glyburide Active fetus with BPP of 8/8 Normal amniotic fluid volume  Recommendations: Continue weekly BPPs Follow up growth scan in 3 weeks. Recommend delivery at 39 weeks due to A2 GDM.  Alpha Gula, MD

## 2012-07-01 NOTE — Patient Instructions (Signed)
Pregnancy - Third Trimester  The third trimester of pregnancy (the last 3 months) is a period of the most rapid growth for you and your baby. The baby approaches a length of 20 inches and a weight of 6 to 10 pounds. The baby is adding on fat and getting ready for life outside your body. While inside, babies have periods of sleeping and waking, suck their thumbs, and hiccups. You can often feel small contractions of the uterus. This is false labor. It is also called Braxton-Hicks contractions. This is like a practice for labor. The usual problems in this stage of pregnancy include more difficulty breathing, swelling of the hands and feet from water retention, and having to urinate more often because of the uterus and baby pressing on your bladder.   PRENATAL EXAMS  · Blood work may continue to be done during prenatal exams. These tests are done to check on your health and the probable health of your baby. Blood work is used to follow your blood levels (hemoglobin). Anemia (low hemoglobin) is common during pregnancy. Iron and vitamins are given to help prevent this. You may also continue to be checked for diabetes. Some of the past blood tests may be done again.  · The size of the uterus is measured during each visit. This makes sure your baby is growing properly according to your pregnancy dates.  · Your blood pressure is checked every prenatal visit. This is to make sure you are not getting toxemia.  · Your urine is checked every prenatal visit for infection, diabetes and protein.  · Your weight is checked at each visit. This is done to make sure gains are happening at the suggested rate and that you and your baby are growing normally.  · Sometimes, an ultrasound is performed to confirm the position and the proper growth and development of the baby. This is a test done that bounces harmless sound waves off the baby so your caregiver can more accurately determine due dates.  · Discuss the type of pain medication and  anesthesia you will have during your labor and delivery.  · Discuss the possibility and anesthesia if a Cesarean Section might be necessary.  · Inform your caregiver if there is any mental or physical violence at home.  Sometimes, a specialized non-stress test, contraction stress test and biophysical profile are done to make sure the baby is not having a problem. Checking the amniotic fluid surrounding the baby is called an amniocentesis. The amniotic fluid is removed by sticking a needle into the belly (abdomen). This is sometimes done near the end of pregnancy if an early delivery is required. In this case, it is done to help make sure the baby's lungs are mature enough for the baby to live outside of the womb. If the lungs are not mature and it is unsafe to deliver the baby, an injection of cortisone medication is given to the mother 1 to 2 days before the delivery. This helps the baby's lungs mature and makes it safer to deliver the baby.  CHANGES OCCURING IN THE THIRD TRIMESTER OF PREGNANCY  Your body goes through many changes during pregnancy. They vary from person to person. Talk to your caregiver about changes you notice and are concerned about.  · During the last trimester, you have probably had an increase in your appetite. It is normal to have cravings for certain foods. This varies from person to person and pregnancy to pregnancy.  · You may begin to   get stretch marks on your hips, abdomen, and breasts. These are normal changes in the body during pregnancy. There are no exercises or medications to take which prevent this change.  · Constipation may be treated with a stool softener or adding bulk to your diet. Drinking lots of fluids, fiber in vegetables, fruits, and whole grains are helpful.  · Exercising is also helpful. If you have been very active up until your pregnancy, most of these activities can be continued during your pregnancy. If you have been less active, it is helpful to start an exercise  program such as walking. Consult your caregiver before starting exercise programs.  · Avoid all smoking, alcohol, un-prescribed drugs, herbs and "street drugs" during your pregnancy. These chemicals affect the formation and growth of the baby. Avoid chemicals throughout the pregnancy to ensure the delivery of a healthy infant.  · Backache, varicose veins and hemorrhoids may develop or get worse.  · You will tire more easily in the third trimester, which is normal.  · The baby's movements may be stronger and more often.  · You may become short of breath easily.  · Your belly button may stick out.  · A yellow discharge may leak from your breasts called colostrum.  · You may have a bloody mucus discharge. This usually occurs a few days to a week before labor begins.  HOME CARE INSTRUCTIONS   · Keep your caregiver's appointments. Follow your caregiver's instructions regarding medication use, exercise, and diet.  · During pregnancy, you are providing food for you and your baby. Continue to eat regular, well-balanced meals. Choose foods such as meat, fish, milk and other low fat dairy products, vegetables, fruits, and whole-grain breads and cereals. Your caregiver will tell you of the ideal weight gain.  · A physical sexual relationship may be continued throughout pregnancy if there are no other problems such as early (premature) leaking of amniotic fluid from the membranes, vaginal bleeding, or belly (abdominal) pain.  · Exercise regularly if there are no restrictions. Check with your caregiver if you are unsure of the safety of your exercises. Greater weight gain will occur in the last 2 trimesters of pregnancy. Exercising helps:  · Control your weight.  · Get you in shape for labor and delivery.  · You lose weight after you deliver.  · Rest a lot with legs elevated, or as needed for leg cramps or low back pain.  · Wear a good support or jogging bra for breast tenderness during pregnancy. This may help if worn during  sleep. Pads or tissues may be used in the bra if you are leaking colostrum.  · Do not use hot tubs, steam rooms, or saunas.  · Wear your seat belt when driving. This protects you and your baby if you are in an accident.  · Avoid raw meat, cat litter boxes and soil used by cats. These carry germs that can cause birth defects in the baby.  · It is easier to loose urine during pregnancy. Tightening up and strengthening the pelvic muscles will help with this problem. You can practice stopping your urination while you are going to the bathroom. These are the same muscles you need to strengthen. It is also the muscles you would use if you were trying to stop from passing gas. You can practice tightening these muscles up 10 times a set and repeating this about 3 times per day. Once you know what muscles to tighten up, do not perform these   exercises during urination. It is more likely to cause an infection by backing up the urine.  · Ask for help if you have financial, counseling or nutritional needs during pregnancy. Your caregiver will be able to offer counseling for these needs as well as refer you for other special needs.  · Make a list of emergency phone numbers and have them available.  · Plan on getting help from family or friends when you go home from the hospital.  · Make a trial run to the hospital.  · Take prenatal classes with the father to understand, practice and ask questions about the labor and delivery.  · Prepare the baby's room/nursery.  · Do not travel out of the city unless it is absolutely necessary and with the advice of your caregiver.  · Wear only low or no heal shoes to have better balance and prevent falling.  MEDICATIONS AND DRUG USE IN PREGNANCY  · Take prenatal vitamins as directed. The vitamin should contain 1 milligram of folic acid. Keep all vitamins out of reach of children. Only a couple vitamins or tablets containing iron may be fatal to a baby or young child when ingested.  · Avoid use  of all medications, including herbs, over-the-counter medications, not prescribed or suggested by your caregiver. Only take over-the-counter or prescription medicines for pain, discomfort, or fever as directed by your caregiver. Do not use aspirin, ibuprofen (Motrin®, Advil®, Nuprin®) or naproxen (Aleve®) unless OK'd by your caregiver.  · Let your caregiver also know about herbs you may be using.  · Alcohol is related to a number of birth defects. This includes fetal alcohol syndrome. All alcohol, in any form, should be avoided completely. Smoking will cause low birth rate and premature babies.  · Street/illegal drugs are very harmful to the baby. They are absolutely forbidden. A baby born to an addicted mother will be addicted at birth. The baby will go through the same withdrawal an adult does.  SEEK MEDICAL CARE IF:  You have any concerns or worries during your pregnancy. It is better to call with your questions if you feel they cannot wait, rather than worry about them.  DECISIONS ABOUT CIRCUMCISION  You may or may not know the sex of your baby. If you know your baby is a boy, it may be time to think about circumcision. Circumcision is the removal of the foreskin of the penis. This is the skin that covers the sensitive end of the penis. There is no proven medical need for this. Often this decision is made on what is popular at the time or based upon religious beliefs and social issues. You can discuss these issues with your caregiver or pediatrician.  SEEK IMMEDIATE MEDICAL CARE IF:   · An unexplained oral temperature above 102° F (38.9° C) develops, or as your caregiver suggests.  · You have leaking of fluid from the vagina (birth canal). If leaking membranes are suspected, take your temperature and tell your caregiver of this when you call.  · There is vaginal spotting, bleeding or passing clots. Tell your caregiver of the amount and how many pads are used.  · You develop a bad smelling vaginal discharge with  a change in the color from clear to white.  · You develop vomiting that lasts more than 24 hours.  · You develop chills or fever.  · You develop shortness of breath.  · You develop burning on urination.  · You loose more than 2 pounds of weight   or gain more than 2 pounds of weight or as suggested by your caregiver.  · You notice sudden swelling of your face, hands, and feet or legs.  · You develop belly (abdominal) pain. Round ligament discomfort is a common non-cancerous (benign) cause of abdominal pain in pregnancy. Your caregiver still must evaluate you.  · You develop a severe headache that does not go away.  · You develop visual problems, blurred or double vision.  · If you have not felt your baby move for more than 1 hour. If you think the baby is not moving as much as usual, eat something with sugar in it and lie down on your left side for an hour. The baby should move at least 4 to 5 times per hour. Call right away if your baby moves less than that.  · You fall, are in a car accident or any kind of trauma.  · There is mental or physical violence at home.  Document Released: 01/24/2001 Document Revised: 04/24/2011 Document Reviewed: 07/29/2008  [employer]® Patient Information ©2013 [employer].

## 2012-07-01 NOTE — Progress Notes (Signed)
p-77 

## 2012-07-03 ENCOUNTER — Other Ambulatory Visit: Payer: Self-pay | Admitting: Advanced Practice Midwife

## 2012-07-03 DIAGNOSIS — O24319 Unspecified pre-existing diabetes mellitus in pregnancy, unspecified trimester: Secondary | ICD-10-CM

## 2012-07-10 ENCOUNTER — Encounter: Payer: Medicaid Other | Admitting: Advanced Practice Midwife

## 2012-07-10 ENCOUNTER — Ambulatory Visit (HOSPITAL_COMMUNITY)
Admission: RE | Admit: 2012-07-10 | Discharge: 2012-07-10 | Disposition: A | Discharge status: Home / Self Care | Payer: Medicaid Other | Source: Ambulatory Visit | Attending: Obstetrics & Gynecology | Admitting: Obstetrics & Gynecology

## 2012-07-10 DIAGNOSIS — O99810 Abnormal glucose complicating pregnancy: Secondary | ICD-10-CM

## 2012-07-10 DIAGNOSIS — O34219 Maternal care for unspecified type scar from previous cesarean delivery: Secondary | ICD-10-CM | POA: Insufficient documentation

## 2012-07-10 DIAGNOSIS — O24319 Unspecified pre-existing diabetes mellitus in pregnancy, unspecified trimester: Secondary | ICD-10-CM

## 2012-07-10 DIAGNOSIS — O09299 Supervision of pregnancy with other poor reproductive or obstetric history, unspecified trimester: Secondary | ICD-10-CM | POA: Insufficient documentation

## 2012-07-10 DIAGNOSIS — O24919 Unspecified diabetes mellitus in pregnancy, unspecified trimester: Secondary | ICD-10-CM | POA: Insufficient documentation

## 2012-07-10 NOTE — Progress Notes (Signed)
[facility] ultrasound  Indication: 32 yr old [id number] at [redacted]w[redacted]d with gestational diabetes A2 for BPP.  Findings: 1. Single intrauterine pregnancy. 2. Anterior placenta without evidence of previa. 3. Normal amniotic fluid index; although at the upper limits of normal. 4. The limited anatomy survey is normal. 5. Normal biophysical profile of 8/8.  Recommendations: 1. Gestational diabetes: - on glyburide - recommend fetal growth every 4 weeks; due in 2 weeks - recommend continue antenatal testing  - recommend delivery by estimated due date but not prior to 39 weeks in the absence of other complications - recommend strict glucose control with fastings <90 and 2 hour post prandial <120  Eulis Foster, MD

## 2012-07-12 ENCOUNTER — Other Ambulatory Visit (HOSPITAL_COMMUNITY): Payer: Self-pay | Admitting: Obstetrics and Gynecology

## 2012-07-12 DIAGNOSIS — O99810 Abnormal glucose complicating pregnancy: Secondary | ICD-10-CM

## 2012-07-15 ENCOUNTER — Ambulatory Visit (INDEPENDENT_AMBULATORY_CARE_PROVIDER_SITE_OTHER): Payer: Medicaid Other | Admitting: Advanced Practice Midwife

## 2012-07-15 ENCOUNTER — Encounter: Payer: Self-pay | Admitting: Advanced Practice Midwife

## 2012-07-15 ENCOUNTER — Ambulatory Visit (HOSPITAL_COMMUNITY)
Admission: RE | Admit: 2012-07-15 | Discharge: 2012-07-15 | Disposition: A | Discharge status: Home / Self Care | Payer: Medicaid Other | Source: Ambulatory Visit | Attending: Obstetrics & Gynecology | Admitting: Obstetrics & Gynecology

## 2012-07-15 VITALS — BP 112/70 | Wt 205.0 lb

## 2012-07-15 DIAGNOSIS — Z3480 Encounter for supervision of other normal pregnancy, unspecified trimester: Secondary | ICD-10-CM

## 2012-07-15 DIAGNOSIS — O09299 Supervision of pregnancy with other poor reproductive or obstetric history, unspecified trimester: Secondary | ICD-10-CM | POA: Insufficient documentation

## 2012-07-15 DIAGNOSIS — O99810 Abnormal glucose complicating pregnancy: Secondary | ICD-10-CM

## 2012-07-15 DIAGNOSIS — O24419 Gestational diabetes mellitus in pregnancy, unspecified control: Secondary | ICD-10-CM

## 2012-07-15 DIAGNOSIS — O24919 Unspecified diabetes mellitus in pregnancy, unspecified trimester: Secondary | ICD-10-CM | POA: Insufficient documentation

## 2012-07-15 DIAGNOSIS — O34219 Maternal care for unspecified type scar from previous cesarean delivery: Secondary | ICD-10-CM | POA: Insufficient documentation

## 2012-07-15 LAB — OB RESULTS CONSOLE GC/CHLAMYDIA: Gonorrhea: NEGATIVE

## 2012-07-15 NOTE — Progress Notes (Signed)
FBS 57-95. 2 hr PCs 105-119  Per Dr Vincenza Hews:  - recommend fetal growth every 4 weeks; due in 2 weeks  - recommend continue antenatal testing  - recommend delivery by estimated due date but not prior to 39 weeks in the absence of other complications  - recommend strict glucose control with fastings <90 and 2 hour post prandial <120 Reviewed this with patient.

## 2012-07-15 NOTE — Progress Notes (Signed)
p-77 

## 2012-07-15 NOTE — Patient Instructions (Addendum)
Vaginal Delivery Your caregiver must first be sure you are in labor. Signs of labor include:  You may pass what is called "the mucus plug" before labor begins. This is a small amount of blood stained mucus.  Regular uterine contractions.  The time between contractions get closer together.  The discomfort and pain gradually gets more intense.  Pains are mostly located in the back.  Pains get worse when walking.  The cervix (the opening of the uterus) becomes thinner (begins to efface) and opens up (dilates). Once you are in labor and admitted into the hospital or care center, your caregiver will do the following:  A complete physical examination.  Check your vital signs (blood pressure, pulse, temperature and the fetal heart rate).  Do a vaginal examination (using a sterile glove and lubricant) to determine:  The position (presentation) of the baby (head [vertex] or buttock first).  The level (station) of the baby's head in the birth canal.  The effacement and dilatation of the cervix.  An electronic monitor is usually placed on your abdomen. The monitor follows the length and intensity of the contractions, as well as the baby's heart rate.  Usually, your caregiver will insert an IV in your arm with a bottle of sugar water. This is done as a precaution so that medications can be given to you quickly during labor or delivery. NORMAL LABOR AND DELIVERY IS DIVIDED UP INTO 3 STAGES: First Stage This is when regular contractions begin and the cervix begins to efface and dilate. This stage can last from 3 to 15 hours. The end of the first stage is when the cervix is 100% effaced and 10 centimeters dilated. Pain medications may be given by   Injection (morphine, demerol, etc.)  Regional anesthesia (spinal, caudal or epidural, anesthetics given in different locations of the spine). Paracervical pain medication may be given, which is an injection of and anesthetic on each side of the  cervix. A pregnant woman may request to have "Natural Childbirth" which is not to have any medications or anesthesia during her labor and delivery. Second Stage This is when the baby comes down through the birth canal (vagina) and is born. This can take 1 to 4 hours. As the baby's head comes down through the birth canal, you may feel like you are going to have a bowel movement. You will get the urge to bear down and push until the baby is delivered. As the baby's head is being delivered, the caregiver will decide if an episiotomy (a cut in the perineum and vagina area) is needed to prevent tearing of the tissue in this area. The episiotomy is sewn up after the delivery of the baby and placenta. Sometimes a mask with nitrous oxide is given for the mother to breath during the delivery of the baby to help if there is too much pain. The end of Stage 2 is when the baby is fully delivered. Then when the umbilical cord stops pulsating it is clamped and cut. Third Stage The third stage begins after the baby is completely delivered and ends after the placenta (afterbirth) is delivered. This usually takes 5 to 30 minutes. After the placenta is delivered, a medication is given either by intravenous or injection to help contract the uterus and prevent bleeding. The third stage is not painful and pain medication is usually not necessary. If an episiotomy was done, it is repaired at this time. After the delivery, the mother is watched and monitored closely for   1 to 2 hours to make sure there is no postpartum bleeding (hemorrhage). If there is a lot of bleeding, medication is given to contract the uterus and stop the bleeding. Document Released: 11/09/2007 Document Revised: 10/25/2011 Document Reviewed: 11/09/2007 [employer] Patient Information 2014 [employer].  

## 2012-07-16 LAB — GC/CHLAMYDIA PROBE AMP: CT Probe RNA: NEGATIVE

## 2012-07-18 LAB — CULTURE, BETA STREP (GROUP B ONLY)

## 2012-07-22 ENCOUNTER — Inpatient Hospital Stay (HOSPITAL_COMMUNITY): Admission: RE | Admit: 2012-07-22 | Payer: Medicaid Other | Source: Ambulatory Visit

## 2012-07-22 ENCOUNTER — Ambulatory Visit (HOSPITAL_COMMUNITY)
Admission: RE | Admit: 2012-07-22 | Discharge: 2012-07-22 | Disposition: A | Discharge status: Home / Self Care | Payer: Medicaid Other | Source: Ambulatory Visit | Attending: Advanced Practice Midwife | Admitting: Advanced Practice Midwife

## 2012-07-22 ENCOUNTER — Ambulatory Visit (INDEPENDENT_AMBULATORY_CARE_PROVIDER_SITE_OTHER): Payer: Medicaid Other | Admitting: Family

## 2012-07-22 VITALS — BP 119/80 | Wt 208.0 lb

## 2012-07-22 DIAGNOSIS — O34219 Maternal care for unspecified type scar from previous cesarean delivery: Secondary | ICD-10-CM | POA: Insufficient documentation

## 2012-07-22 DIAGNOSIS — O24919 Unspecified diabetes mellitus in pregnancy, unspecified trimester: Secondary | ICD-10-CM | POA: Insufficient documentation

## 2012-07-22 DIAGNOSIS — Z3483 Encounter for supervision of other normal pregnancy, third trimester: Secondary | ICD-10-CM

## 2012-07-22 DIAGNOSIS — Z3480 Encounter for supervision of other normal pregnancy, unspecified trimester: Secondary | ICD-10-CM

## 2012-07-22 DIAGNOSIS — O09299 Supervision of pregnancy with other poor reproductive or obstetric history, unspecified trimester: Secondary | ICD-10-CM | POA: Insufficient documentation

## 2012-07-22 DIAGNOSIS — O99810 Abnormal glucose complicating pregnancy: Secondary | ICD-10-CM

## 2012-07-22 NOTE — Progress Notes (Signed)
p-84 

## 2012-07-22 NOTE — Progress Notes (Signed)
Kathryn Peterson  was seen today for an ultrasound appointment.  See full report in [facility].  Impression: Single IUP at 37 0/7 weeks A2 GDM on Glyburide The estimated fetal weight today is > 90th %tile (3838 g) Mild polyhydramnios (AFI 26.5 cm) Active fetus with BPP of 8/8  Recommendations: Continue weekly BPPs. Recommend delivery by EDD but not prior to 39 weeks in the absence of complications.  Alpha Gula, MD

## 2012-07-22 NOTE — Progress Notes (Signed)
Blood sugar log; FBS 60-90; After meal: 103-120 (checked 5 after meals only); BPP today 8/8; desires IOL scheduled for 38.6 (evening) to delay husband missing work.  Plans to discuss with attending.

## 2012-07-23 ENCOUNTER — Encounter: Payer: Self-pay | Admitting: *Deleted

## 2012-07-25 ENCOUNTER — Other Ambulatory Visit: Payer: Self-pay | Admitting: Obstetrics & Gynecology

## 2012-07-25 DIAGNOSIS — O24919 Unspecified diabetes mellitus in pregnancy, unspecified trimester: Secondary | ICD-10-CM

## 2012-07-29 ENCOUNTER — Ambulatory Visit (HOSPITAL_COMMUNITY)
Admission: RE | Admit: 2012-07-29 | Discharge: 2012-07-29 | Disposition: A | Discharge status: Home / Self Care | Payer: Medicaid Other | Source: Ambulatory Visit | Attending: Obstetrics & Gynecology | Admitting: Obstetrics & Gynecology

## 2012-07-29 ENCOUNTER — Ambulatory Visit (INDEPENDENT_AMBULATORY_CARE_PROVIDER_SITE_OTHER): Payer: Medicaid Other | Admitting: Family

## 2012-07-29 VITALS — BP 132/79 | Wt 209.0 lb

## 2012-07-29 DIAGNOSIS — O24919 Unspecified diabetes mellitus in pregnancy, unspecified trimester: Secondary | ICD-10-CM | POA: Insufficient documentation

## 2012-07-29 DIAGNOSIS — O24419 Gestational diabetes mellitus in pregnancy, unspecified control: Secondary | ICD-10-CM

## 2012-07-29 DIAGNOSIS — Z3689 Encounter for other specified antenatal screening: Secondary | ICD-10-CM | POA: Insufficient documentation

## 2012-07-29 DIAGNOSIS — Z3480 Encounter for supervision of other normal pregnancy, unspecified trimester: Secondary | ICD-10-CM

## 2012-07-29 DIAGNOSIS — O99810 Abnormal glucose complicating pregnancy: Secondary | ICD-10-CM

## 2012-07-29 NOTE — Progress Notes (Signed)
p=91 

## 2012-07-29 NOTE — Progress Notes (Signed)
FBS 64-98 (1 abnl); BK pp 54-116; does not check PP after lunch or dinner.  BPP today, results not in Epic; IOL scheduled on 6/23 (39 wks) at 0730.  Staff notified regarding pt increased needle phobia.

## 2012-07-30 ENCOUNTER — Telehealth (HOSPITAL_COMMUNITY): Payer: Self-pay | Admitting: *Deleted

## 2012-07-30 NOTE — Telephone Encounter (Signed)
Preadmission screen  

## 2012-07-31 ENCOUNTER — Telehealth (HOSPITAL_COMMUNITY): Payer: Self-pay | Admitting: *Deleted

## 2012-07-31 ENCOUNTER — Encounter (HOSPITAL_COMMUNITY): Payer: Self-pay | Admitting: *Deleted

## 2012-07-31 NOTE — Telephone Encounter (Signed)
Preadmission screen  

## 2012-08-01 ENCOUNTER — Observation Stay (HOSPITAL_COMMUNITY)
Admission: AD | Admit: 2012-08-01 | Discharge: 2012-08-02 | Disposition: A | Discharge status: Home / Self Care | Payer: Medicaid Other | Source: Ambulatory Visit | Attending: Obstetrics and Gynecology | Admitting: Obstetrics and Gynecology

## 2012-08-01 ENCOUNTER — Encounter (HOSPITAL_COMMUNITY): Payer: Self-pay | Admitting: *Deleted

## 2012-08-01 DIAGNOSIS — O99810 Abnormal glucose complicating pregnancy: Secondary | ICD-10-CM

## 2012-08-01 DIAGNOSIS — IMO0002 Reserved for concepts with insufficient information to code with codable children: Secondary | ICD-10-CM

## 2012-08-01 DIAGNOSIS — Y92009 Unspecified place in unspecified non-institutional (private) residence as the place of occurrence of the external cause: Secondary | ICD-10-CM | POA: Insufficient documentation

## 2012-08-01 DIAGNOSIS — R109 Unspecified abdominal pain: Secondary | ICD-10-CM

## 2012-08-01 DIAGNOSIS — S3991XA Unspecified injury of abdomen, initial encounter: Secondary | ICD-10-CM

## 2012-08-01 DIAGNOSIS — O9A213 Injury, poisoning and certain other consequences of external causes complicating pregnancy, third trimester: Secondary | ICD-10-CM

## 2012-08-01 DIAGNOSIS — O479 False labor, unspecified: Principal | ICD-10-CM | POA: Insufficient documentation

## 2012-08-01 DIAGNOSIS — W1809XA Striking against other object with subsequent fall, initial encounter: Secondary | ICD-10-CM | POA: Insufficient documentation

## 2012-08-01 DIAGNOSIS — Z3480 Encounter for supervision of other normal pregnancy, unspecified trimester: Secondary | ICD-10-CM

## 2012-08-01 MED ORDER — PRENATAL MULTIVITAMIN CH
1.0000 | ORAL_TABLET | Freq: Every day | ORAL | Status: DC
  Administered 2012-08-01: 1 via ORAL
  Filled 2012-08-01: qty 1

## 2012-08-01 MED ORDER — CALCIUM CARBONATE ANTACID 500 MG PO CHEW: 2.0000 | CHEWABLE_TABLET | ORAL | Status: DC | PRN

## 2012-08-01 MED ORDER — DOCUSATE SODIUM 100 MG PO CAPS: 100.0000 mg | ORAL_CAPSULE | Freq: Every day | ORAL | Status: DC

## 2012-08-01 MED ORDER — GLYBURIDE 5 MG PO TABS
10.0000 mg | ORAL_TABLET | Freq: Every day | ORAL | Status: DC
  Administered 2012-08-01: 10 mg via ORAL
  Filled 2012-08-01: qty 2

## 2012-08-01 MED ORDER — SERTRALINE HCL 50 MG PO TABS
50.0000 mg | ORAL_TABLET | Freq: Every day | ORAL | Status: DC
  Administered 2012-08-01: 50 mg via ORAL
  Filled 2012-08-01: qty 1

## 2012-08-01 MED ORDER — PRENATAL MULTIVITAMIN CH: 1.0000 | ORAL_TABLET | Freq: Every day | ORAL | Status: DC

## 2012-08-01 MED ORDER — GLYBURIDE 5 MG PO TABS: 10.0000 mg | ORAL_TABLET | Freq: Every day | ORAL | Status: DC

## 2012-08-01 MED ORDER — ACETAMINOPHEN 325 MG PO TABS
650.0000 mg | ORAL_TABLET | Freq: Four times a day (QID) | ORAL | Status: DC | PRN
Start: 2012-08-01 — End: 2012-08-01
  Administered 2012-08-01: 650 mg via ORAL
  Filled 2012-08-01: qty 2

## 2012-08-01 MED ORDER — SERTRALINE HCL 50 MG PO TABS
50.0000 mg | ORAL_TABLET | Freq: Every day | ORAL | Status: DC
  Filled 2012-08-01 (×2): qty 1

## 2012-08-01 MED ORDER — ZOLPIDEM TARTRATE 5 MG PO TABS: 5.0000 mg | ORAL_TABLET | Freq: Every evening | ORAL | Status: DC | PRN

## 2012-08-01 MED ORDER — ACETAMINOPHEN 325 MG PO TABS: 650.0000 mg | ORAL_TABLET | ORAL | Status: DC | PRN

## 2012-08-01 MED ORDER — FAMOTIDINE 20 MG PO TABS: 20.0000 mg | ORAL_TABLET | Freq: Every day | ORAL | Status: DC
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
## 2012-08-01 NOTE — Progress Notes (Signed)
S/O:  Eating dinner Denies abdominal pain and reports fetal movement  FHR 140s, average variability  Occasional uterine irritability, no overt contractions  Will continue to observe
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
## 2012-08-01 NOTE — MAU Note (Signed)
Fell on knees and then landed on abd

## 2012-08-02 NOTE — Progress Notes (Signed)
Patient ID: Kathryn Peterson, female   DOB: 01-12-1981, 32 y.o.   MRN: [id number]  Still sleeping  FHR has been stable and reactive  Occasional mild contractions  Plan probable discharge home today

## 2012-08-02 NOTE — Discharge Summary (Signed)
Antenatal Physician Discharge Summary  Patient ID: Kathryn Peterson MRN: [id number] DOB/AGE: 1980-12-10 32 y.o.  Admit date: 08/01/2012 Discharge date: 08/02/2012  Admission Diagnoses: Fall, Abdominal wall injury  Discharge Diagnoses: Fall, Abdominal wall injury  Prenatal Procedures: ultrasound  Treatments: None  Hospital Course:  Kathryn Peterson is a 32 y.o. G3P1011 at [redacted]w[redacted]d who was admitted after suffering a fall where she hit her abdomen.  Patient noted to have some irregular contractions in the MAU and was admitted for overnight observation.  Patient did well during hospitalization and was monitored closely (via continuous fetal monitoring and toco).  Patient had uterine irritability and irregular contractions (every 3-8 mins).  Cervix remained unchanged.  Discharge Exam: BP 125/73  Pulse 91  Temp(Src) 98.1 F (36.7 C) (Oral)  Resp 20  Ht 5\' 7"  (1.702 m)  Wt 94.802 kg (209 lb)  BMI 32.73 kg/m2  LMP 08/28/2011 Gen: well appearing, resting comfortably, NAD.  Heart: RRR. No m/r/g.  Lungs: CTAB. No rales, rhonchi, or wheezing.  Abd: gravid but otherwise soft, nontender to palpation. No signs of trauma/injury.  Ext: no appreciable lower extremity edema bilaterally. Small abrasion on left knee.  Neuro: No focal deficits.  Cervical exam: Dilation: Fingertip (no change) Effacement (%): Thick Station: -3 Exam by:: Dr Adriana Simas  Discharge Condition: stable  Disposition: 01-Home or Self Care     Medication List    TAKE these medications       ACCU-CHEK FASTCLIX LANCETS Misc  1 Units by Percutaneous route 4 (four) times daily.     acetaminophen 500 MG tablet  Commonly known as:  TYLENOL  Take 1,000 mg by mouth daily as needed for pain (For headache).     glucose blood test strip  Use as instructed     glyBURIDE 5 MG tablet  Commonly known as:  DIABETA  Take 10 mg by mouth at bedtime.     hydrocortisone 2.5 % cream  Apply topically 2 (two) times daily.     prenatal  multivitamin Tabs  Take 1 tablet by mouth at bedtime.     ranitidine 150 MG tablet  Commonly known as:  ZANTAC  Take 150 mg by mouth 2 (two) times daily.     sertraline 50 MG tablet  Commonly known as:  ZOLOFT  Take 50 mg by mouth daily.       Follow-up Information   Follow up with [facility] [place]. (Please attend your regular scheduled follow up appointment.)    Contact information:   [address] [place] [address] [place] [address] [phone]      Signed: Everlene Other M.D. 08/02/2012, 12:13 PM  Evaluation and management procedures were performed by Resident physician under my supervision/collaboration. Chart reviewed, patient examined by me and I agree with management and plan. Danae Orleans, CNM 08/02/2012 1:57 PM

## 2012-08-03 NOTE — Discharge Summary (Signed)
No signs of abruption.  Reactive NST. Agree with above note.  Kathryn Peterson H. 08/03/2012 5:16 AM

## 2012-08-05 ENCOUNTER — Encounter (HOSPITAL_COMMUNITY): Payer: Self-pay | Admitting: Anesthesiology

## 2012-08-05 ENCOUNTER — Inpatient Hospital Stay (HOSPITAL_COMMUNITY)
Admission: RE | Admit: 2012-08-05 | Discharge: 2012-08-08 | DRG: 766 | Disposition: A | Discharge status: Home / Self Care | Payer: Medicaid Other | Source: Ambulatory Visit | Attending: Family Medicine | Admitting: Family Medicine

## 2012-08-05 ENCOUNTER — Encounter (HOSPITAL_COMMUNITY): Payer: Self-pay

## 2012-08-05 ENCOUNTER — Encounter (HOSPITAL_COMMUNITY)
Admission: RE | Disposition: A | Discharge status: Home / Self Care | Payer: Self-pay | Source: Ambulatory Visit | Attending: Family Medicine

## 2012-08-05 ENCOUNTER — Inpatient Hospital Stay (HOSPITAL_COMMUNITY): Payer: Medicaid Other | Admitting: Anesthesiology

## 2012-08-05 ENCOUNTER — Ambulatory Visit (HOSPITAL_COMMUNITY): Payer: Medicaid Other

## 2012-08-05 VITALS — BP 106/56 | HR 77 | Temp 98.1°F | Resp 16 | Ht 67.0 in | Wt 209.0 lb

## 2012-08-05 DIAGNOSIS — O99814 Abnormal glucose complicating childbirth: Secondary | ICD-10-CM

## 2012-08-05 DIAGNOSIS — O3660X0 Maternal care for excessive fetal growth, unspecified trimester, not applicable or unspecified: Secondary | ICD-10-CM | POA: Diagnosis present

## 2012-08-05 DIAGNOSIS — O34219 Maternal care for unspecified type scar from previous cesarean delivery: Secondary | ICD-10-CM

## 2012-08-05 DIAGNOSIS — O99810 Abnormal glucose complicating pregnancy: Secondary | ICD-10-CM

## 2012-08-05 LAB — CBC
Hemoglobin: 10.5 g/dL — ABNORMAL LOW (ref 12.0–15.0)
MCH: 28.7 pg (ref 26.0–34.0)
MCV: 90.4 fL (ref 78.0–100.0)
RBC: 3.66 MIL/uL — ABNORMAL LOW (ref 3.87–5.11)

## 2012-08-05 LAB — GLUCOSE, CAPILLARY: Glucose-Capillary: 93 mg/dL (ref 70–99)

## 2012-08-05 SURGERY — Surgical Case
Anesthesia: Spinal | Site: Abdomen | Wound class: Clean Contaminated

## 2012-08-05 MED ORDER — FENTANYL CITRATE 0.05 MG/ML IJ SOLN
INTRAMUSCULAR | Status: AC
  Filled 2012-08-05: qty 2

## 2012-08-05 MED ORDER — FENTANYL CITRATE 0.05 MG/ML IJ SOLN
INTRAMUSCULAR | Status: DC | PRN
  Administered 2012-08-05: 75 ug via INTRAVENOUS

## 2012-08-05 MED ORDER — SERTRALINE HCL 50 MG PO TABS
50.0000 mg | ORAL_TABLET | Freq: Every day | ORAL | Status: DC
  Administered 2012-08-06 – 2012-08-07 (×2): 50 mg via ORAL
  Filled 2012-08-05 (×3): qty 1

## 2012-08-05 MED ORDER — NALBUPHINE SYRINGE 5 MG/0.5 ML
10.0000 mg | INJECTION | INTRAMUSCULAR | Status: DC | PRN
  Administered 2012-08-05: 10 mg via INTRAVENOUS
  Filled 2012-08-05 (×2): qty 0.5

## 2012-08-05 MED ORDER — ONDANSETRON HCL 4 MG/2ML IJ SOLN: 4.0000 mg | Freq: Four times a day (QID) | INTRAMUSCULAR | Status: DC | PRN

## 2012-08-05 MED ORDER — BUPIVACAINE HCL (PF) 0.25 % IJ SOLN
INTRAMUSCULAR | Status: DC | PRN
  Administered 2012-08-05: 30 mL

## 2012-08-05 MED ORDER — OXYTOCIN 10 UNIT/ML IJ SOLN
40.0000 [IU] | INTRAVENOUS | Status: DC | PRN
  Administered 2012-08-05: 40 [IU] via INTRAVENOUS

## 2012-08-05 MED ORDER — LACTATED RINGERS IV SOLN: 500.0000 mL | INTRAVENOUS | Status: DC | PRN

## 2012-08-05 MED ORDER — SCOPOLAMINE 1 MG/3DAYS TD PT72
1.0000 | MEDICATED_PATCH | Freq: Once | TRANSDERMAL | Status: DC
  Administered 2012-08-06: 1.5 mg via TRANSDERMAL

## 2012-08-05 MED ORDER — 0.9 % SODIUM CHLORIDE (POUR BTL) OPTIME
TOPICAL | Status: DC | PRN
  Administered 2012-08-05: 1000 mL

## 2012-08-05 MED ORDER — CEFAZOLIN SODIUM-DEXTROSE 2-3 GM-% IV SOLR
INTRAVENOUS | Status: DC | PRN
  Administered 2012-08-05: 2 g via INTRAVENOUS

## 2012-08-05 MED ORDER — ZOLPIDEM TARTRATE 5 MG PO TABS: 5.0000 mg | ORAL_TABLET | Freq: Every evening | ORAL | Status: DC | PRN

## 2012-08-05 MED ORDER — MORPHINE SULFATE 0.5 MG/ML IJ SOLN
INTRAMUSCULAR | Status: AC
  Filled 2012-08-05: qty 10

## 2012-08-05 MED ORDER — IBUPROFEN 600 MG PO TABS: 600.0000 mg | ORAL_TABLET | Freq: Four times a day (QID) | ORAL | Status: DC | PRN

## 2012-08-05 MED ORDER — MIDAZOLAM HCL 2 MG/2ML IJ SOLN: 0.5000 mg | Freq: Once | INTRAMUSCULAR | Status: AC | PRN

## 2012-08-05 MED ORDER — FAMOTIDINE 20 MG PO TABS
20.0000 mg | ORAL_TABLET | Freq: Two times a day (BID) | ORAL | Status: DC
  Filled 2012-08-05: qty 1

## 2012-08-05 MED ORDER — MIDAZOLAM HCL 5 MG/5ML IJ SOLN
INTRAMUSCULAR | Status: DC | PRN
  Administered 2012-08-05: 1 mg via INTRAVENOUS
  Administered 2012-08-05: 2 mg via INTRAVENOUS
  Administered 2012-08-05: 1 mg via INTRAVENOUS

## 2012-08-05 MED ORDER — MORPHINE SULFATE (PF) 0.5 MG/ML IJ SOLN
INTRAMUSCULAR | Status: DC | PRN
  Administered 2012-08-05: 4.85 mg via INTRAVENOUS

## 2012-08-05 MED ORDER — PROMETHAZINE HCL 25 MG/ML IJ SOLN: 6.2500 mg | INTRAMUSCULAR | Status: DC | PRN

## 2012-08-05 MED ORDER — MEPERIDINE HCL 25 MG/ML IJ SOLN: 6.2500 mg | INTRAMUSCULAR | Status: DC | PRN

## 2012-08-05 MED ORDER — KETAMINE HCL 10 MG/ML IJ SOLN
INTRAMUSCULAR | Status: AC
  Filled 2012-08-05: qty 1

## 2012-08-05 MED ORDER — LORAZEPAM 1 MG PO TABS
2.0000 mg | ORAL_TABLET | Freq: Once | ORAL | Status: AC
  Administered 2012-08-05: 2 mg via ORAL
  Filled 2012-08-05: qty 2

## 2012-08-05 MED ORDER — FENTANYL CITRATE 0.05 MG/ML IJ SOLN
INTRAMUSCULAR | Status: DC | PRN
  Administered 2012-08-05: 25 ug via INTRATHECAL

## 2012-08-05 MED ORDER — FENTANYL CITRATE 0.05 MG/ML IJ SOLN: 25.0000 ug | INTRAMUSCULAR | Status: DC | PRN

## 2012-08-05 MED ORDER — OXYTOCIN 40 UNITS IN LACTATED RINGERS INFUSION - SIMPLE MED: 62.5000 mL/h | INTRAVENOUS | Status: DC

## 2012-08-05 MED ORDER — KETOROLAC TROMETHAMINE 30 MG/ML IJ SOLN
30.0000 mg | Freq: Four times a day (QID) | INTRAMUSCULAR | Status: AC | PRN
  Administered 2012-08-06 (×2): 30 mg via INTRAVENOUS
  Filled 2012-08-05: qty 1

## 2012-08-05 MED ORDER — ONDANSETRON HCL 4 MG/2ML IJ SOLN
INTRAMUSCULAR | Status: DC | PRN
  Administered 2012-08-05: 4 mg via INTRAVENOUS

## 2012-08-05 MED ORDER — KETAMINE HCL 10 MG/ML IJ SOLN
INTRAMUSCULAR | Status: DC | PRN
  Administered 2012-08-05 (×3): 10 mg via INTRAVENOUS

## 2012-08-05 MED ORDER — CEFAZOLIN SODIUM-DEXTROSE 2-3 GM-% IV SOLR
INTRAVENOUS | Status: AC
  Filled 2012-08-05: qty 50

## 2012-08-05 MED ORDER — ACETAMINOPHEN 325 MG PO TABS
650.0000 mg | ORAL_TABLET | ORAL | Status: DC | PRN
  Administered 2012-08-05: 650 mg via ORAL
  Filled 2012-08-05: qty 2

## 2012-08-05 MED ORDER — KETOROLAC TROMETHAMINE 30 MG/ML IJ SOLN: 30.0000 mg | Freq: Four times a day (QID) | INTRAMUSCULAR | Status: AC | PRN

## 2012-08-05 MED ORDER — CITRIC ACID-SODIUM CITRATE 334-500 MG/5ML PO SOLN
30.0000 mL | ORAL | Status: DC | PRN
  Administered 2012-08-05: 30 mL via ORAL
  Filled 2012-08-05: qty 15

## 2012-08-05 MED ORDER — BUPIVACAINE HCL (PF) 0.25 % IJ SOLN
INTRAMUSCULAR | Status: AC
  Filled 2012-08-05: qty 30

## 2012-08-05 MED ORDER — LIDOCAINE HCL (PF) 1 % IJ SOLN: 30.0000 mL | INTRAMUSCULAR | Status: DC | PRN

## 2012-08-05 MED ORDER — OXYTOCIN 10 UNIT/ML IJ SOLN
INTRAMUSCULAR | Status: AC
  Filled 2012-08-05: qty 4

## 2012-08-05 MED ORDER — BUPIVACAINE IN DEXTROSE 0.75-8.25 % IT SOLN
INTRATHECAL | Status: DC | PRN
  Administered 2012-08-05: 12 mg via INTRATHECAL

## 2012-08-05 MED ORDER — OXYTOCIN 40 UNITS IN LACTATED RINGERS INFUSION - SIMPLE MED
1.0000 m[IU]/min | INTRAVENOUS | Status: DC
  Administered 2012-08-05: 1 m[IU]/min via INTRAVENOUS
  Administered 2012-08-05 (×2): 2 m[IU]/min via INTRAVENOUS
  Filled 2012-08-05: qty 1000

## 2012-08-05 MED ORDER — MORPHINE SULFATE (PF) 0.5 MG/ML IJ SOLN
INTRAMUSCULAR | Status: DC | PRN
  Administered 2012-08-05: .15 mg via INTRATHECAL

## 2012-08-05 MED ORDER — LACTATED RINGERS IV SOLN
INTRAVENOUS | Status: DC
  Administered 2012-08-05 (×4): via INTRAVENOUS

## 2012-08-05 MED ORDER — OXYTOCIN BOLUS FROM INFUSION: 500.0000 mL | INTRAVENOUS | Status: DC

## 2012-08-05 MED ORDER — MIDAZOLAM HCL 2 MG/2ML IJ SOLN
INTRAMUSCULAR | Status: AC
  Filled 2012-08-05: qty 4

## 2012-08-05 MED ORDER — OXYCODONE-ACETAMINOPHEN 5-325 MG PO TABS: 1.0000 | ORAL_TABLET | ORAL | Status: DC | PRN

## 2012-08-05 MED ORDER — ONDANSETRON HCL 4 MG/2ML IJ SOLN
INTRAMUSCULAR | Status: AC
  Filled 2012-08-05: qty 2

## 2012-08-05 SURGICAL SUPPLY — 33 items
BENZOIN TINCTURE PRP APPL 2/3 (GAUZE/BANDAGES/DRESSINGS) ×2 IMPLANT
CLAMP CORD UMBIL (MISCELLANEOUS) ×2 IMPLANT
CLOTH BEACON ORANGE TIMEOUT ST (SAFETY) ×2 IMPLANT
DRAPE LG THREE QUARTER DISP (DRAPES) ×2 IMPLANT
DRSG OPSITE POSTOP 4X10 (GAUZE/BANDAGES/DRESSINGS) ×2 IMPLANT
DRSG OPSITE POSTOP 4X12 (GAUZE/BANDAGES/DRESSINGS) ×2 IMPLANT
DURAPREP 26ML APPLICATOR (WOUND CARE) ×2 IMPLANT
ELECT REM PT RETURN 9FT ADLT (ELECTROSURGICAL) ×2
ELECTRODE REM PT RTRN 9FT ADLT (ELECTROSURGICAL) ×1 IMPLANT
EXTRACTOR VACUUM M CUP 4 TUBE (SUCTIONS) IMPLANT
GLOVE BIOGEL PI IND STRL 7.0 (GLOVE) ×1 IMPLANT
GLOVE BIOGEL PI INDICATOR 7.0 (GLOVE) ×1
GLOVE ECLIPSE 7.0 STRL STRAW (GLOVE) ×4 IMPLANT
GOWN PREVENTION PLUS XLARGE (GOWN DISPOSABLE) ×2 IMPLANT
GOWN STRL REIN XL XLG (GOWN DISPOSABLE) ×4 IMPLANT
KIT ABG SYR 3ML LUER SLIP (SYRINGE) ×2 IMPLANT
NEEDLE HYPO 22GX1.5 SAFETY (NEEDLE) ×2 IMPLANT
NEEDLE HYPO 25X5/8 SAFETYGLIDE (NEEDLE) ×2 IMPLANT
NS IRRIG 1000ML POUR BTL (IV SOLUTION) ×2 IMPLANT
PACK C SECTION WH (CUSTOM PROCEDURE TRAY) ×2 IMPLANT
PAD OB MATERNITY 4.3X12.25 (PERSONAL CARE ITEMS) ×2 IMPLANT
RETAINER VISCERAL (MISCELLANEOUS) ×2 IMPLANT
RTRCTR C-SECT PINK 25CM LRG (MISCELLANEOUS) ×2 IMPLANT
STAPLER VISISTAT 35W (STAPLE) IMPLANT
STRIP CLOSURE SKIN 1/2X4 (GAUZE/BANDAGES/DRESSINGS) ×2 IMPLANT
SUT PLAIN 2 0 XLH (SUTURE) ×2 IMPLANT
SUT VIC AB 0 CTX 36 (SUTURE) ×4
SUT VIC AB 0 CTX36XBRD ANBCTRL (SUTURE) ×4 IMPLANT
SUT VIC AB 4-0 KS 27 (SUTURE) ×2 IMPLANT
SYR 30ML LL (SYRINGE) ×2 IMPLANT
TOWEL OR 17X24 6PK STRL BLUE (TOWEL DISPOSABLE) ×6 IMPLANT
TRAY FOLEY CATH 14FR (SET/KITS/TRAYS/PACK) ×2 IMPLANT
WATER STERILE IRR 1000ML POUR (IV SOLUTION) IMPLANT

## 2012-08-05 NOTE — Progress Notes (Addendum)
   Subjective: Pt reports increased cramping.  Objective: BP 117/77  Pulse 96  Temp(Src) 98.3 F (36.8 C) (Oral)  Resp 20  Ht 5\' 7"  (1.702 m)  Wt 94.802 kg (209 lb)  BMI 32.73 kg/m2  LMP 08/28/2011      FHT:  FHR: 130's bpm, variability: moderate,  accelerations:  Present,  decelerations:  Absent UC:   regular, every 2-5 minutes SVE:   Dilation: 4.5 Effacement (%): 60 Station: -3 Exam by:: Roney Marion, CNM Foley bulb comes out with tugging. Labs: Lab Results  Component Value Date   WBC 9.1 08/05/2012   HGB 10.5* 08/05/2012   HCT 33.1* 08/05/2012   MCV 90.4 08/05/2012   PLT 294 08/05/2012    Assessment / Plan: Induction of Labor - Gestational Diabetes  Labor: Induction of Labor - Gestational Diabetes Preeclampsia:  n/a Fetal Wellbeing:  Category I Pain Control:  Labor support without medications I/D:  GBS neg Anticipated MOD:  NSVD  Begin pitocin augmentation.  [facility] 08/05/2012, 6:17 PM

## 2012-08-05 NOTE — Progress Notes (Addendum)
Notified by RN that patient did not allow placement of IV.

## 2012-08-05 NOTE — Progress Notes (Signed)
Kathryn Peterson is a 32 y.o. G3P1011 at [redacted]w[redacted]d who presents for IOL secondary to gestational diabetes.   Subjective: Patient states that she feels well.  Patient is requesting to eat.   Objective: BP 118/80   Pulse 86   Temp(Src) 98.5 F (36.9 C) (Oral)   Resp 20   Ht 5\' 7"  (1.702 m)   Wt 94.802 kg (209 lb)   BMI 32.73 kg/m2   LMP 08/28/2011      FHT:  FHR: 150 bpm, variability: moderate,  accelerations:  Abscent,  decelerations:  Absent UC:   irregular SVE:    1/30/-3  Labs: Lab Results  Component Value Date   WBC 8.3 06/04/2012   HGB 10.7* 06/04/2012   HCT 31.6* 06/04/2012   MCV 91.9 06/04/2012   PLT 330 06/04/2012    Assessment / Plan: Induction of labor due to gestational diabetes.  Patient with h/o prior C-section.  Foley bulb placed without complication.  Labor: Foley bulb placed Preeclampsia:  None Fetal Wellbeing:  Category I Pain Control:  Labor support without medications I/D:  GBS negative Anticipated MOD:  NSVD  Holly Stegall 08/05/2012, 11:30 AM

## 2012-08-05 NOTE — Anesthesia Preprocedure Evaluation (Signed)
Anesthesia Evaluation  Patient identified by MRN, date of birth, ID band Patient awake    Reviewed: Allergy & Precautions, H&P , NPO status , Patient's Chart, lab work & pertinent test results  Airway Mallampati: III      Dental no notable dental hx.    Pulmonary neg pulmonary ROS,  breath sounds clear to auscultation  Pulmonary exam normal       Cardiovascular Exercise Tolerance: Good negative cardio ROS  Rhythm:regular Rate:Normal     Neuro/Psych negative neurological ROS  negative psych ROS   GI/Hepatic negative GI ROS, Neg liver ROS,   Endo/Other  negative endocrine ROSdiabetes  Renal/GU negative Renal ROS  negative genitourinary   Musculoskeletal   Abdominal Normal abdominal exam  (+)   Peds  Hematology negative hematology ROS (+)   Anesthesia Other Findings   Reproductive/Obstetrics (+) Pregnancy                           Anesthesia Physical Anesthesia Plan  ASA: III and emergent  Anesthesia Plan: Spinal   Post-op Pain Management:    Induction:   Airway Management Planned:   Additional Equipment:   Intra-op Plan:   Post-operative Plan:   Informed Consent: I have reviewed the patients History and Physical, chart, labs and discussed the procedure including the risks, benefits and alternatives for the proposed anesthesia with the patient or authorized representative who has indicated his/her understanding and acceptance.     Plan Discussed with: Anesthesiologist, CRNA and Surgeon  Anesthesia Plan Comments:         Anesthesia Quick Evaluation

## 2012-08-05 NOTE — Progress Notes (Signed)
Pt just drank some regular Dr. Reino Kent & is eating [employer] potato chips.  She wanted her cbg to be obtained at 1600, knowing her sugar would be elevated.  CBG 120-will notify CNM & MD.

## 2012-08-05 NOTE — MAU Provider Note (Signed)
Attestation of Attending Supervision of Advanced Practitioner (CNM/NP): Evaluation and management procedures were performed by the Advanced Practitioner under my supervision and collaboration.  I have reviewed the Advanced Practitioner's note and chart, and I agree with the management and plan.  Garren Greenman 08/05/2012 11:48 AM   

## 2012-08-05 NOTE — Progress Notes (Signed)
This note also relates to the following rows which could not be included: Rate (mL/hr) Oxytocin - Cannot attach notes to extension rows Concentration Oxytocin - Cannot attach notes to extension rows   Awaiting the or

## 2012-08-05 NOTE — H&P (Signed)
Attestation of Attending Supervision of Advanced Practitioner (CNM/NP): Evaluation and management procedures were performed by the Advanced Practitioner under my supervision and collaboration.  I have reviewed the Advanced Practitioner's note and chart, and I agree with the management and plan.  Briellah Baik 08/05/2012 11:48 AM

## 2012-08-05 NOTE — H&P (Signed)
Chart reviewed and agree with management and plan.  
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
## 2012-08-05 NOTE — Progress Notes (Addendum)
To the or, Pt extremely anxious related to spinal, emotional support provided,. Pt requesting not to remember any part of the spinal experience. Requesting to have iv meds to help her to obtain a spinal.

## 2012-08-05 NOTE — Progress Notes (Signed)
  Subjective: Pt reports increased cramping and feeling contractions.  States she feels the Ativan is working may want to try IV start again.    Objective: BP 118/80  Pulse 86  Temp(Src) 98.5 F (36.9 C) (Oral)  Resp 20  Ht 5\' 7"  (1.702 m)  Wt 94.802 kg (209 lb)  BMI 32.73 kg/m2  LMP 08/28/2011      FHT:  FHR: 140's bpm, variability: moderate,  accelerations:  Present,  decelerations:  Present variables UC:   irregular, every 3-6 minutes SVE:   Dilation: 1 Effacement (%): 30 Station: -3 Exam by:: Dr. Jerelyn Scott  Labs: Lab Results  Component Value Date   WBC 8.3 06/04/2012   HGB 10.7* 06/04/2012   HCT 31.6* 06/04/2012   MCV 91.9 06/04/2012   PLT 330 06/04/2012    Assessment / Plan: Induction of Labor - Gestational Diabetes/A2  Labor: Induction of Labor - Gestational Diabetes/A2 Preeclampsia:  n/a Fetal Wellbeing:  Category II Pain Control:  Labor support without medications I/D:  GBS neg Anticipated MOD:  NSVD  [facility] 08/05/2012, 3:48 PM

## 2012-08-05 NOTE — Progress Notes (Signed)
Pt refused CBG at 1300; previous CBG at 1000; order reads to obtain CBG's four hours apart.  Asked pt to let RN know when she will let CBG be obtained.

## 2012-08-05 NOTE — Progress Notes (Addendum)
Dr Thad Ranger at the bedside for discussion of pt's agitation. Pt SROM at 2145 and is now rating uc's a 10/10. When asked about pain control. Pt requests to try iv pain med.

## 2012-08-05 NOTE — Anesthesia Procedure Notes (Signed)
Spinal  Patient location during procedure: OR Staffing Anesthesiologist: Angus Seller., [facility] R. Performed by: anesthesiologist  Preanesthetic Checklist Completed: patient identified, site marked, surgical consent, pre-op evaluation, timeout performed, IV checked, risks and benefits discussed and monitors and equipment checked Spinal Block Patient position: sitting Prep: ChloraPrep Patient monitoring: heart rate, cardiac monitor, continuous pulse ox and blood pressure Approach: midline Location: L3-4 Injection technique: single-shot Needle Needle type: Sprotte  Needle gauge: 24 G Needle length: 9 cm Assessment Sensory level: T4 Additional Notes Patient identified.  Risk benefits discussed including failed block, incomplete pain control, headache, nerve damage, paralysis, blood pressure changes, nausea, vomiting, reactions to medication both toxic or allergic, and postpartum back pain.  Patient expressed understanding and wished to proceed.  All questions were answered.  Sterile technique used throughout procedure.  CSF was clear.  No parasthesia or other complications.  Please see nursing notes for vital signs.

## 2012-08-05 NOTE — Progress Notes (Signed)
Pt requesting c-section. Is getting no pain relief from iv pain med. Dr Thad Ranger remains at the bedside.

## 2012-08-06 ENCOUNTER — Encounter (HOSPITAL_COMMUNITY): Payer: Self-pay

## 2012-08-06 LAB — TYPE AND SCREEN: Antibody Screen: NEGATIVE

## 2012-08-06 LAB — GLUCOSE, CAPILLARY: Glucose-Capillary: 112 mg/dL — ABNORMAL HIGH (ref 70–99)

## 2012-08-06 MED ORDER — SENNOSIDES-DOCUSATE SODIUM 8.6-50 MG PO TABS
2.0000 | ORAL_TABLET | Freq: Every day | ORAL | Status: DC
  Administered 2012-08-06 – 2012-08-07 (×2): 2 via ORAL

## 2012-08-06 MED ORDER — FLEET ENEMA 7-19 GM/118ML RE ENEM: 1.0000 | ENEMA | Freq: Every day | RECTAL | Status: DC | PRN

## 2012-08-06 MED ORDER — LORAZEPAM 2 MG/ML IJ SOLN
2.0000 mg | Freq: Once | INTRAMUSCULAR | Status: DC
  Filled 2012-08-06: qty 1

## 2012-08-06 MED ORDER — DIPHENHYDRAMINE HCL 25 MG PO CAPS
25.0000 mg | ORAL_CAPSULE | ORAL | Status: DC | PRN
  Filled 2012-08-06: qty 1

## 2012-08-06 MED ORDER — FERROUS SULFATE 325 (65 FE) MG PO TABS
325.0000 mg | ORAL_TABLET | Freq: Two times a day (BID) | ORAL | Status: DC
  Administered 2012-08-06 – 2012-08-08 (×4): 325 mg via ORAL
  Filled 2012-08-06 (×6): qty 1

## 2012-08-06 MED ORDER — ZOLPIDEM TARTRATE 5 MG PO TABS: 5.0000 mg | ORAL_TABLET | Freq: Every evening | ORAL | Status: DC | PRN

## 2012-08-06 MED ORDER — DIPHENHYDRAMINE HCL 50 MG/ML IJ SOLN: 25.0000 mg | INTRAMUSCULAR | Status: DC | PRN

## 2012-08-06 MED ORDER — SCOPOLAMINE 1 MG/3DAYS TD PT72
MEDICATED_PATCH | TRANSDERMAL | Status: AC
  Filled 2012-08-06: qty 1

## 2012-08-06 MED ORDER — NALOXONE HCL 1 MG/ML IJ SOLN
1.0000 ug/kg/h | INTRAVENOUS | Status: DC | PRN
  Filled 2012-08-06: qty 2

## 2012-08-06 MED ORDER — METOCLOPRAMIDE HCL 5 MG/ML IJ SOLN: 10.0000 mg | Freq: Three times a day (TID) | INTRAMUSCULAR | Status: DC | PRN

## 2012-08-06 MED ORDER — OXYTOCIN 40 UNITS IN LACTATED RINGERS INFUSION - SIMPLE MED: 62.5000 mL/h | INTRAVENOUS | Status: AC

## 2012-08-06 MED ORDER — WITCH HAZEL-GLYCERIN EX PADS: 1.0000 "application " | MEDICATED_PAD | CUTANEOUS | Status: DC | PRN

## 2012-08-06 MED ORDER — LACTATED RINGERS IV SOLN
INTRAVENOUS | Status: DC
  Administered 2012-08-06: 01:00:00 via INTRAVENOUS

## 2012-08-06 MED ORDER — MENTHOL 3 MG MT LOZG
1.0000 | LOZENGE | OROMUCOSAL | Status: DC | PRN
  Filled 2012-08-06: qty 9

## 2012-08-06 MED ORDER — PRENATAL MULTIVITAMIN CH
1.0000 | ORAL_TABLET | Freq: Every day | ORAL | Status: DC
  Administered 2012-08-06 – 2012-08-08 (×3): 1 via ORAL
  Filled 2012-08-06 (×4): qty 1

## 2012-08-06 MED ORDER — LANOLIN HYDROUS EX OINT: 1.0000 "application " | TOPICAL_OINTMENT | CUTANEOUS | Status: DC | PRN

## 2012-08-06 MED ORDER — NALBUPHINE SYRINGE 5 MG/0.5 ML
5.0000 mg | INJECTION | INTRAMUSCULAR | Status: DC | PRN
  Filled 2012-08-06: qty 1

## 2012-08-06 MED ORDER — DIBUCAINE 1 % RE OINT
1.0000 "application " | TOPICAL_OINTMENT | RECTAL | Status: DC | PRN
  Filled 2012-08-06: qty 28

## 2012-08-06 MED ORDER — BISACODYL 10 MG RE SUPP
10.0000 mg | Freq: Every day | RECTAL | Status: DC | PRN
  Filled 2012-08-06: qty 1

## 2012-08-06 MED ORDER — RHO D IMMUNE GLOBULIN 1500 UNIT/2ML IJ SOLN
300.0000 ug | Freq: Once | INTRAMUSCULAR | Status: DC
  Filled 2012-08-06: qty 2

## 2012-08-06 MED ORDER — NALOXONE HCL 0.4 MG/ML IJ SOLN: 0.4000 mg | INTRAMUSCULAR | Status: DC | PRN

## 2012-08-06 MED ORDER — DIPHENHYDRAMINE HCL 50 MG/ML IJ SOLN: 12.5000 mg | INTRAMUSCULAR | Status: DC | PRN

## 2012-08-06 MED ORDER — KETOROLAC TROMETHAMINE 30 MG/ML IJ SOLN
INTRAMUSCULAR | Status: AC
  Filled 2012-08-06: qty 1

## 2012-08-06 MED ORDER — TETANUS-DIPHTH-ACELL PERTUSSIS 5-2.5-18.5 LF-MCG/0.5 IM SUSP: 0.5000 mL | Freq: Once | INTRAMUSCULAR | Status: DC

## 2012-08-06 MED ORDER — ONDANSETRON HCL 4 MG/2ML IJ SOLN: 4.0000 mg | Freq: Three times a day (TID) | INTRAMUSCULAR | Status: DC | PRN

## 2012-08-06 MED ORDER — ACETAMINOPHEN 10 MG/ML IV SOLN
1000.0000 mg | Freq: Four times a day (QID) | INTRAVENOUS | Status: AC | PRN
  Filled 2012-08-06: qty 100

## 2012-08-06 MED ORDER — ONDANSETRON HCL 4 MG/2ML IJ SOLN: 4.0000 mg | INTRAMUSCULAR | Status: DC | PRN

## 2012-08-06 MED ORDER — DIPHENHYDRAMINE HCL 25 MG PO CAPS
25.0000 mg | ORAL_CAPSULE | Freq: Four times a day (QID) | ORAL | Status: DC | PRN
  Filled 2012-08-06: qty 1

## 2012-08-06 MED ORDER — MEASLES, MUMPS & RUBELLA VAC ~~LOC~~ INJ
0.5000 mL | INJECTION | Freq: Once | SUBCUTANEOUS | Status: DC
  Filled 2012-08-06: qty 0.5

## 2012-08-06 MED ORDER — IBUPROFEN 600 MG PO TABS
600.0000 mg | ORAL_TABLET | Freq: Four times a day (QID) | ORAL | Status: DC
  Administered 2012-08-06 – 2012-08-08 (×8): 600 mg via ORAL
  Filled 2012-08-06 (×9): qty 1

## 2012-08-06 MED ORDER — SODIUM CHLORIDE 0.9 % IJ SOLN: 3.0000 mL | INTRAMUSCULAR | Status: DC | PRN

## 2012-08-06 MED ORDER — SIMETHICONE 80 MG PO CHEW
80.0000 mg | CHEWABLE_TABLET | Freq: Three times a day (TID) | ORAL | Status: DC
  Administered 2012-08-06 – 2012-08-08 (×9): 80 mg via ORAL

## 2012-08-06 MED ORDER — OXYCODONE-ACETAMINOPHEN 5-325 MG PO TABS
1.0000 | ORAL_TABLET | ORAL | Status: DC | PRN
  Administered 2012-08-06: 1 via ORAL
  Administered 2012-08-07 (×2): 2 via ORAL
  Administered 2012-08-07 (×2): 1 via ORAL
  Administered 2012-08-07 – 2012-08-08 (×2): 2 via ORAL
  Filled 2012-08-06 (×2): qty 2
  Filled 2012-08-06 (×2): qty 1
  Filled 2012-08-06: qty 2
  Filled 2012-08-06 (×2): qty 1
  Filled 2012-08-06: qty 2

## 2012-08-06 MED ORDER — ONDANSETRON HCL 4 MG PO TABS: 4.0000 mg | ORAL_TABLET | ORAL | Status: DC | PRN

## 2012-08-06 MED ORDER — SIMETHICONE 80 MG PO CHEW: 80.0000 mg | CHEWABLE_TABLET | ORAL | Status: DC | PRN

## 2012-08-06 NOTE — Progress Notes (Signed)
08/06/12 1000  Clinical Encounter Type  Visited With Patient and family together (Kathryn Peterson)  Visit Type Initial  Referral From Nurse  Spiritual Encounters  Spiritual Needs (baby in NICU)   Made initial contact with Kathryn Peterson and Kathryn Peterson as they prepared eagerly to make Kathryn Peterson's first visit to baby in the NICU.  Briefly introduced Spiritual Care.  Please page as needed for support:  [phone].  Thank you.  [address] [place], [place] [phone]

## 2012-08-06 NOTE — Progress Notes (Signed)
Subjective: Postpartum Day 1: Cesarean Delivery Patient reports that she is feeling well.  No complaints this am. Good urine output (catheter still in place).  Has not yet gotten out of bed, but will do so this am.  Objective: Vital signs in last 24 hours: Temp:  [97.5 F (36.4 C)-98.5 F (36.9 C)] 98.1 F (36.7 C) (06/24 0643) Pulse Rate:  [58-100] 68 (06/24 0200) Resp:  [12-20] 18 (06/24 0643) BP: (95-134)/(58-93) 95/58 mmHg (06/24 0643) SpO2:  [96 %-99 %] 96 % (06/24 0643) Weight:  [209 lb (94.802 kg)] 209 lb (94.802 kg) (06/23 0837)  Physical Exam:  General: alert, cooperative and no distress Lochia: appropriate Uterine Fundus: firm Incision: healing well, no significant drainage, no dehiscence, no significant erythema DVT Evaluation: No evidence of DVT seen on physical exam. No cords or calf tenderness. No significant calf/ankle edema.   Recent Labs  08/05/12 1530  HGB 10.5*  HCT 33.1*    Assessment/Plan: Status post Cesarean section. Doing well postoperatively.  Continue current care. Patient to have Rhogam and CBC today after visiting baby in NICU (will need sedation given severe needle phobia).  Everlene Other 08/06/2012, 7:47 AM

## 2012-08-06 NOTE — Op Note (Signed)
I was present for the procedure and agree with the documentation here.

## 2012-08-06 NOTE — Progress Notes (Signed)
UR completed 

## 2012-08-06 NOTE — Progress Notes (Signed)
RN in room and pt and pt husband informed RN of extreme NEEDLE PHOBIA WITH PT. PT states she can take injections, but not IV'S nor blood draws without being heavily sedated. Plan to do Rhogam workup and  And CBC later after trip to NICU AND BACK in room. PT agreed.Dr. Thad Ranger notified.

## 2012-08-06 NOTE — Transfer of Care (Signed)
Immediate Anesthesia Transfer of Care Note  Patient: Kathryn Peterson  Procedure(s) Performed: Procedure(s) with comments: CESAREAN SECTION (N/A) - Repeat Cesarean Section Delivery Baby Boy @ 2313, Apgars 8/9  Patient Location: PACU  Anesthesia Type:Spinal  Level of Consciousness: awake  Airway & Oxygen Therapy: Patient Spontanous Breathing  Post-op Assessment: Report given to PACU RN and Post -op Vital signs reviewed and stable  Post vital signs: stable  Complications: No apparent anesthesia complications

## 2012-08-06 NOTE — Lactation Note (Signed)
This note was copied from the chart of [facility]. Lactation Consultation Note    Initial consult with this mom of a term LGA baby in NICU. 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 Mom was able to partially breast feed her last baby for 6 months, from her right breast only, and  Needed to supplement with formula. I showed mom how to hand express, and she had lots of colostrum(4 mls) from her right breast, and drops from the left.  I reviewed the NICU book on providing breast milk for your NICU baby, and the lactation services. Mom knows to call for questions/concern.s   Patient Name: Kathryn Peterson [id number] Date: 08/06/2012 Reason for consult: Initial assessment;NICU baby;Other (Comment) (LGA, hypoglycemia)   Maternal Data Formula Feeding for Exclusion: Yes Reason for exclusion: Mother's choice to formula and breast feed on admission (mom with PCOS and hyplastic brests) Infant to breast within first hour of birth: No Breastfeeding delayed due to:: Infant status Has patient been taught Hand Expression?: Yes Does the patient have breastfeeding experience prior to this delivery?: Yes  Feeding Feeding Type: Formula Feeding method: Bottle Nipple Type: Regular Length of feed: 10 min  LATCH Score/Interventions                      Lactation Tools Discussed/Used Tools: Pump Breast pump type: Double-Electric Breast Pump WIC Program: Yes (mom has [employer] at home) Pump Review: Setup, frequency, and cleaning;Milk Storage;Other (comment) (premie setting, hand expression, teaching on providing breas) Initiated by:: c Ilija Maxim at 10 hours post partum Date initiated:: 08/06/12   Consult Status Consult Status: Follow-up Date: 08/07/12 Follow-up type: In-patient    Alfred Levins 08/06/2012, 1:56 PM

## 2012-08-06 NOTE — Op Note (Signed)
[name] PROCEDURE DATE: 08/05/2012 - 08/06/2012  PREOPERATIVE DIAGNOSIS: Intrauterine pregnancy at  [redacted]w[redacted]d weeks gestation; previous c-section, declined TOLAC, Class A2 Gestational Diabetes, LGA fetus  POSTOPERATIVE DIAGNOSIS: The same  PROCEDURE: Repeat Low Transverse Cesarean Section  SURGEON:  Tinnie Gens, MD  ASSISTANT:  Napoleon Form, MD  INDICATIONS: Kathryn Peterson is a 32 y.o. G3P2011 at [redacted]w[redacted]d here for cesarean section secondary to the indications listed under preoperative diagnosis; please see preoperative note for further details. The patient attempted TOLAC and underwent labor induction. After a foley bulb was placed and expelled, pitocin was started. The patient had spontaneous rupture of membranes, after which her contractions became much more painful, and she requested a repeat cesarean section. The risks of cesarean section were discussed with the patient including but were not limited to: bleeding which may require transfusion or reoperation; infection which may require antibiotics; injury to bowel, bladder, ureters or other surrounding organs; injury to the fetus; need for additional procedures including hysterectomy in the event of a life-threatening hemorrhage; placental abnormalities wth subsequent pregnancies, incisional problems, thromboembolic phenomenon and other postoperative/anesthesia complications.   The patient concurred with the proposed plan, giving informed written consent for the procedure.    FINDINGS:  Viable female infant in cephalic, occiput posterior presentation.  Apgars 8 and 9.  Arterial pH 7.13.  Clear amniotic fluid.  Intact placenta, three vessel cord, nuchal x 1.  Normal uterus, fallopian tubes and ovaries bilaterally.  ANESTHESIA: Spinal INTRAVENOUS FLUIDS: 2200 ml ESTIMATED BLOOD LOSS: 800 ml URINE OUTPUT:  150 ml. c;ear SPECIMENS: Placenta sent to pathology COMPLICATIONS: None immediate  PROCEDURE IN DETAIL:  The patient preoperatively received  intravenous antibiotics and had sequential compression devices applied to her lower extremities.  She was then taken to the operating room where spinal anesthesia was administered and was found to be adequate. She was then placed in a dorsal supine position with a leftward tilt, and prepped and draped in a sterile manner.  A foley catheter was placed into her bladder and attached to constant gravity.  After an adequate timeout was performed, a Pfannenstiel skin incision was made with scalpel and carried through to the underlying layer of fascia. The fascia was incised in the midline, and this incision was extended bilaterally using the Mayo scissors.  The rectus muscles were bluntly dissected off the superior aspect of the fascial incision. The rectus muscles were separated in the midline bluntly and the peritoneum was entered bluntly. Alexis O self-retaining retractor was placed in the abdomen. Attention was turned to the lower uterine segment where a low transverse hysterotomy incision was made with a scalpel and extended bilaterally bluntly.  The infant was successfully delivered, the cord was clamped and cut and the infant was handed over to awaiting neonatology team. Uterine massage was then administered, and the placenta delivered intact with a three-vessel cord. The uterus was then cleared of clot and debris.  The hysterotomy was closed with 0 Vicryl in a running locked fashion. The pelvis was cleared of all clot and debris. Hemostasis was confirmed on all surfaces.  The peritoneum was reapproximated using 0 Vicryl in purse-string stitch.  The fascia was then closed using 0 Vicryl in a running fashion.  The subcutaneous layer was irrigated, then reapproximated with 0 plain suture in interrupted sutures. 30 ml of 0.25% Marcaine was injected in the subcutaneous tissues around the incision. The skin was closed with a 4-0 Vicryl subcuticular stitch. The patient tolerated the procedure well. Sponge, lap,  instrument  and needle counts were correct x 2.  She was taken to the recovery room in stable condition.   Napoleon Form, MD

## 2012-08-06 NOTE — Anesthesia Postprocedure Evaluation (Signed)
  Anesthesia Post Note  Patient: Kathryn Peterson  Procedure(s) Performed: Procedure(s) (LRB): CESAREAN SECTION (N/A)  Anesthesia type: Spinal  Patient location: PACU  Post pain: Pain level controlled  Post assessment: Post-op Vital signs reviewed  Last Vitals:  Filed Vitals:   08/06/12 0015  BP: 110/72  Pulse:   Temp:   Resp:     Post vital signs: Reviewed  Level of consciousness: awake  Complications: No apparent anesthesia complications

## 2012-08-06 NOTE — Progress Notes (Signed)
Pt refusing rhogam study blood draw

## 2012-08-06 NOTE — Progress Notes (Signed)
Patient ID: Kathryn Peterson, female   DOB: 05/12/1980, 32 y.o.   MRN: [id number]  S:  Pt screaming in pain with contractions, states feels like something is ripping, that she can't do this.  O:  Dilation: 5.5 Effacement (%): 60 Cervical Position: Posterior Station: Ballotable Presentation: Vertex Exam by:: Roney Marion, CNM  FHTs:  145, mod var, few accels, some sharp/short variables TOCO:  q 2 minutes  A/P 32 y.o. G3P2011 at [redacted]w[redacted]d with A2 GDM, prior C-section, here for IOL for diabetes, wanting TOLAC - Pain increased significantly, nubain not working, does not want epidural - Pt asking for cesarean section  The risks of cesarean section were discussed with the patient including but were not limited to: bleeding which may require transfusion or reoperation; infection which may require antibiotics; injury to bowel, bladder, ureters or other surrounding organs; injury to the fetus; need for additional procedures including hysterectomy in the event of a life-threatening hemorrhage; placental abnormalities wth subsequent pregnancies, incisional problems, thromboembolic phenomenon and other postoperative/anesthesia complications.  The patient concurred with the proposed plan, giving informed written consent for the procedures.  Patient has been NPO except liquids since 10 AM. She will remain NPO for procedure. Anesthesia and OR aware.  Preoperative prophylactic antibiotics and SCDs ordered on call to the OR.  To OR when ready.\  Napoleon Form, MD

## 2012-08-06 NOTE — Anesthesia Postprocedure Evaluation (Signed)
Anesthesia Post Note  Patient: Kathryn Peterson  Procedure(s) Performed: Procedure(s) (LRB): CESAREAN SECTION (N/A)  Anesthesia type: SAB  Patient location: Mother/Baby  Post pain: Pain level controlled  Post assessment: Post-op Vital signs reviewed  Last Vitals:  Filed Vitals:   08/06/12 0643  BP: 95/58  Pulse:   Temp: 36.7 C  Resp: 18    Post vital signs: Reviewed  Level of consciousness: awake  Complications: No apparent anesthesia complications

## 2012-08-07 ENCOUNTER — Encounter (HOSPITAL_COMMUNITY): Payer: Self-pay | Admitting: Family Medicine

## 2012-08-07 NOTE — Progress Notes (Signed)
I saw and examined patient and agree with above resident note. I reviewed history, delivery summary, labs and vitals. Pt is planning to pump/breastfeed. Baby in NICU for hypoglycemia. Napoleon Form, MD

## 2012-08-07 NOTE — Progress Notes (Signed)
08/07/12 1030  Clinical Encounter Type  Visited With Patient and family together (husband, mom, son (20 mos))  Visit Type Follow-up;Spiritual support;Social support  Spiritual Encounters  Spiritual Needs Emotional  Stress Factors  Patient Stress Factors Loss of control (pt/husband anxious about baby's needs/condition)   Visited twice with Victorino Dike and her husband this morning, first to check in generally and then to follow up with logistical assistance after liaising with baby's RN in NICU.  Relayed several options for family to receive further information from MD/NP (morning rounds, requesting follow-up conversation with provider afterward, requesting provider visit to pt's room).    Will continue to follow for spiritual and emotional support as family adjusts to baby's NICU stay and understanding his changing needs.   Please page as needed:  [phone].  [address] [place], [place] [phone]

## 2012-08-07 NOTE — Progress Notes (Signed)
CSW attempted to meet with MOB to complete assessment for Anxiety and NICU admission, but she was not in her room.  CSW will attempt again at a later time. 

## 2012-08-07 NOTE — Progress Notes (Signed)
Subjective: Postpartum Day 2: Cesarean Delivery Patient reports that she is feeling well.   Up ad lib, Passing flatus, good Urine output. *Of note, patient has now declined Rhogam (after long discussion of risk/benefits)  Objective: Vital signs in last 24 hours: Temp:  [98.1 F (36.7 C)-98.5 F (36.9 C)] 98.1 F (36.7 C) (06/25 0623) Pulse Rate:  [67-95] 78 (06/25 0623) Resp:  [18] 18 (06/25 0623) BP: (103-123)/(59-76) 103/62 mmHg (06/25 0623) SpO2:  [96 %-99 %] 98 % (06/25 4132)  Physical Exam:  General: alert, cooperative and no distress Lochia: appropriate Uterine Fundus: firm Incision: healing well, no significant drainage, no dehiscence, no significant erythema DVT Evaluation: No evidence of DVT seen on physical exam. No cords or calf tenderness. No significant calf/ankle edema.   Recent Labs  08/05/12 1530  HGB 10.5*  HCT 33.1*    Assessment/Plan: Status post Cesarean section. Doing well postoperatively.  Continue current care.  Patient is breastfeeding/pumping.  Planning Vasectomy for contraception.  Patient to be discharged tomorrow.  Everlene Other 08/07/2012, 8:04 AM

## 2012-08-07 NOTE — Progress Notes (Signed)
I saw and examined patient and agree with above resident note. I reviewed history, delivery summary, labs and vitals. Will try to get Rhogam in clinic tomorrow. Napoleon Form, MD

## 2012-08-07 NOTE — Progress Notes (Signed)
RN and Lab Tech in room and attempted aspiration of blood from IV Saline  HEP Lock. Unable to obtain blood from NORMAL SALINE LOCK . Pt refused to have lab draw blood. Pt agrees to sign refusal of lab draw for Rhogam workup.

## 2012-08-07 NOTE — Lactation Note (Signed)
This note was copied from the chart of Boy [facility]. Lactation Consultation Note     Brief follow up consult with mom at baby's bedside today. Therese Sarah is on antibiotics , with tachypnea, decrease o2 sats, and a left shift in his CBC.   Mom is very stressed , and having "lots of pain" s/p c-section. She is hoping to  stay in hospital until  Friday. She is pumping about 5 times a day, and  getting about 2 mls of colostrum.  Mom knows to call for questions/concerns  Patient Name: Boy Sway Albee [id number] Date: 08/07/2012 Reason for consult: Follow-up assessment;NICU baby   Maternal Data    Feeding Feeding Type: Formula Feeding method: Tube/Gavage Length of feed: 30 min  LATCH Score/Interventions                      Lactation Tools Discussed/Used     Consult Status Consult Status: Follow-up Date: 08/08/12 Follow-up type: In-patient    Alfred Levins 08/07/2012, 5:14 PM

## 2012-08-08 MED ORDER — MEASLES, MUMPS & RUBELLA VAC ~~LOC~~ INJ
0.5000 mL | INJECTION | Freq: Once | SUBCUTANEOUS | Status: AC
  Administered 2012-08-08: 0.5 mL via SUBCUTANEOUS
  Filled 2012-08-08: qty 0.5

## 2012-08-08 MED ORDER — SENNOSIDES-DOCUSATE SODIUM 8.6-50 MG PO TABS: 2.0000 | ORAL_TABLET | Freq: Every day | ORAL | Status: DC

## 2012-08-08 MED ORDER — HYDROMORPHONE HCL 2 MG PO TABS
2.0000 mg | ORAL_TABLET | Freq: Once | ORAL | Status: AC
  Administered 2012-08-08: 2 mg via ORAL
  Filled 2012-08-08: qty 1

## 2012-08-08 MED ORDER — IBUPROFEN 600 MG PO TABS: 600.0000 mg | ORAL_TABLET | Freq: Four times a day (QID) | ORAL | Status: DC

## 2012-08-08 MED ORDER — RHO D IMMUNE GLOBULIN 1500 UNIT/2ML IJ SOLN
300.0000 ug | Freq: Once | INTRAMUSCULAR | Status: AC
  Administered 2012-08-08: 300 ug via INTRAMUSCULAR
  Filled 2012-08-08: qty 2

## 2012-08-08 MED ORDER — OXYCODONE-ACETAMINOPHEN 5-325 MG PO TABS: 1.0000 | ORAL_TABLET | ORAL | Status: DC | PRN

## 2012-08-08 NOTE — Discharge Summary (Signed)
Obstetric Discharge Summary Reason for Admission: induction of labor Prenatal Procedures: ultrasound Intrapartum Procedures: cesarean: low cervical, transverse Postpartum Procedures: none Complications-Operative and Postpartum: none Hemoglobin  Date Value Range Status  08/05/2012 10.5* 12.0 - 15.0 g/dL Final     HCT  Date Value Range Status  08/05/2012 33.1* 36.0 - 46.0 % Final    Physical Exam:  General: alert, cooperative and no distress Lochia: appropriate Uterine Fundus: firm Incision: healing well, no significant drainage, no significant erythema DVT Evaluation: No evidence of DVT seen on physical exam. No cords or calf tenderness. No significant calf/ankle edema.  Discharge Diagnoses: Term Pregnancy-delivered  Hospital Course: Florena Kozma is a 32 y.o. G3P2011 at [redacted]w[redacted]d who was admitted to the hospital after presenting for IOL.  Patient declined TOLAC and elected for repeat C-section.  Patient had an uncomplicated RLTCS.   Pt is breast feeding.  Patient and husband planning vasectomy for contraception. She will follow up with [facility] in 6 weeks.   Discharge Information: Date: 08/08/2012 Activity: pelvic rest Diet: routine Medications: PNV, Ibuprofen, Colace and Percocet Condition: stable Instructions: refer to practice specific booklet Discharge to: home Follow-up Information   Call Center for [facility] [place]. (For a postpartum follow up visit)    Contact information:   [address] [place] [address] [phone]      Newborn Data: Live born female  Birth Weight: 10 lb 15.1 oz (4965 g) APGAR: 8, 9  Baby to stay in NICU.  Everlene Other 08/08/2012, 7:51 AM  I saw and examined patient and agree with above resident note. I reviewed history, delivery summary, labs and vitals. Pt to receive one dose of rhophylac prior to discharge after multiple discussions with lab director, patient and other administrators due to pt's extreme  needle phobia and refusal to have additional lab draws. Napoleon Form, MD

## 2012-08-08 NOTE — Lactation Note (Signed)
This note was copied from the chart of Kathryn [facility]. Lactation Consultation Note   Follow up consult with this mom of a NICU baby, now 59 hours  Post partum. Mom is being discharged to home today, and is still experiencing a lot of pain. She expressed concern about her baby, having limits put on the amount of time she can hold him, so that he can stay under phototherapy lights.  Mom has a DEP at home. On exam, her right breast expresses colostrum/transitional milk easily. Mom has not pumped in over 3 hours. I advised her to try and pump every 3 hours, or at least do hand expression every 3. Mom knows to call for questions/concerns. I will follow this family in the NICU.  Patient Name: Kathryn Peterson Date: 08/08/2012 Reason for consult: Follow-up assessment;NICU baby   Maternal Data    Feeding Feeding Type: Formula Feeding method: Tube/Gavage Length of feed: 30 min  LATCH Score/Interventions                      Lactation Tools Discussed/Used     Consult Status Consult Status: PRN Follow-up type: Other (comment) (in NICU)    Alfred Levins 08/08/2012, 10:41 AM
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
## 2012-08-09 LAB — RH IG WORKUP (INCLUDES ABO/RH)

## 2012-08-11 NOTE — Discharge Summary (Signed)
This patient was interviewed, the night before discharge, and a lengthy discussion with the patient over the importance of receiving RhoGAM. Also the importance of getting the correct dose of RhoGAM (Rhophylac), which would require the patient consent to a blood draw. Despite lengthy counseling so the risks of sub-optimal dosing, the patient maintained her informed for a usual to accept the phlebotomy. The patient did finally agreed to accept a standard dose of prophylactic Rh RhoGAM. This was discussed with blood bank parties and up to and including physician director of the laboratory in order to give her the 300 mcg dose of RhoGAM without any additional testing. The patient had previously signed informed refusal to receive RhoGAM of any sort. RhoGAM administered prior to discharge, after oral sedation with Dilaudid 2 mg

## 2012-08-20 ENCOUNTER — Ambulatory Visit (INDEPENDENT_AMBULATORY_CARE_PROVIDER_SITE_OTHER): Payer: Medicaid Other | Admitting: Obstetrics & Gynecology

## 2012-08-20 ENCOUNTER — Encounter: Payer: Self-pay | Admitting: Obstetrics & Gynecology

## 2012-08-20 VITALS — BP 123/76 | HR 86 | Temp 98.6°F | Resp 16 | Ht 64.0 in | Wt 183.0 lb

## 2012-08-20 DIAGNOSIS — Z5189 Encounter for other specified aftercare: Secondary | ICD-10-CM

## 2012-08-20 NOTE — Progress Notes (Signed)
Pt here for incision check.  Well healed with no discharge or erythema. Pt is breastfeeding and husband getting vasectomy. RTC 4 weeks for pp visit.

## 2012-09-16 ENCOUNTER — Other Ambulatory Visit (HOSPITAL_COMMUNITY)
Admission: RE | Admit: 2012-09-16 | Discharge: 2012-09-16 | Disposition: A | Discharge status: Home / Self Care | Payer: Medicaid Other | Source: Ambulatory Visit | Attending: Family | Admitting: Family

## 2012-09-16 ENCOUNTER — Ambulatory Visit (INDEPENDENT_AMBULATORY_CARE_PROVIDER_SITE_OTHER): Payer: Medicaid Other | Admitting: Family

## 2012-09-16 ENCOUNTER — Encounter: Payer: Self-pay | Admitting: Family

## 2012-09-16 VITALS — BP 119/71 | HR 68 | Resp 16 | Ht 64.0 in | Wt 180.0 lb

## 2012-09-16 DIAGNOSIS — Z1151 Encounter for screening for human papillomavirus (HPV): Secondary | ICD-10-CM | POA: Insufficient documentation

## 2012-09-16 DIAGNOSIS — Z01419 Encounter for gynecological examination (general) (routine) without abnormal findings: Secondary | ICD-10-CM | POA: Insufficient documentation

## 2012-09-16 DIAGNOSIS — G8918 Other acute postprocedural pain: Secondary | ICD-10-CM

## 2012-09-16 DIAGNOSIS — R87810 Cervical high risk human papillomavirus (HPV) DNA test positive: Secondary | ICD-10-CM | POA: Insufficient documentation

## 2012-09-16 NOTE — Progress Notes (Signed)
  Subjective:     Kathryn Peterson is a 32 y.o. female who presents for a postpartum visit. She is 6 weeks postpartum following a low cervical transverse Cesarean section. I have fully reviewed the prenatal and intrapartum course. The delivery was at 39 gestational weeks. Outcome: repeat cesarean section, low transverse incision. Anesthesia: spinal. Postpartum course has been complicated by increased pain at incision site and increased back spasms.  Pain at incision site has worsened over past two weeks.  Not sleeping much, approximately 4 hours/night.  Pain is relieved by tylenol.  Baby's course has been unremarkable, discharged home two weeks after delivery due to low glucose. Baby is feeding by both breast and bottle - Kathryn Peterson. Bleeding no bleeding. Bowel function is normal. Bladder function is normal. Patient is not sexually active. Contraception method is none. Postpartum depression screening: negative.  The following portions of the patient's history were reviewed and updated as appropriate: allergies, current medications, past family history, past medical history, past social history, past surgical history and problem list.  Review of Systems Pertinent items are noted in HPI.   Objective:    BP 119/71  Pulse 68  Resp 16  Ht 5\' 4"  (1.626 m)  Wt 180 lb (81.647 kg)  BMI 30.88 kg/m2  Breastfeeding? Yes        General:  alert, cooperative and appears stated age   Breasts:  inspection negative, no nipple discharge or bleeding, no masses or nodularity palpable  Lungs: clear to auscultation bilaterally  Heart:  regular rate and rhythm, S1, S2 normal, no murmur, click, rub or gallop  Abdomen: soft, non-tender; bowel sounds normal; no masses,  no organomegaly; incision site well healed; slight redness, +odor   Vulva:  normal  Vagina: normal vagina, no discharge, exudate, lesion, or erythema; healed well  Cervix:  no cervical motion tenderness  Corpus: normal size, contour, position,  consistency, mobility, non-tender  Adnexa:  normal adnexa  Rectal Exam: Not performed.    Assessment:     Postop Pain  Pap smear done at today's visit.    Plan:    1. Contraception: vasectomy 2. RX Clindamycin 250 QID x 10 days.  . Follow up in: 1 weeks or as needed.

## 2012-09-17 MED ORDER — CLINDAMYCIN HCL 300 MG PO CAPS: 300.0000 mg | ORAL_CAPSULE | Freq: Four times a day (QID) | ORAL | Status: DC

## 2012-09-30 ENCOUNTER — Encounter: Payer: Self-pay | Admitting: Advanced Practice Midwife

## 2012-09-30 ENCOUNTER — Ambulatory Visit (INDEPENDENT_AMBULATORY_CARE_PROVIDER_SITE_OTHER): Payer: Medicaid Other | Admitting: Advanced Practice Midwife

## 2012-09-30 VITALS — BP 113/64 | HR 68 | Temp 97.3°F | Resp 16 | Ht 64.0 in | Wt 181.0 lb

## 2012-09-30 DIAGNOSIS — M5380 Other specified dorsopathies, site unspecified: Secondary | ICD-10-CM

## 2012-09-30 DIAGNOSIS — M62830 Muscle spasm of back: Secondary | ICD-10-CM

## 2012-09-30 DIAGNOSIS — Z5189 Encounter for other specified aftercare: Secondary | ICD-10-CM

## 2012-09-30 DIAGNOSIS — G8918 Other acute postprocedural pain: Secondary | ICD-10-CM

## 2012-09-30 NOTE — Progress Notes (Signed)
S: 32 y.o. G3P2011 8 weeks post RLTCS presents to office for f/u on incision.  Two weeks ago at her 6 week PP visit she presented with incisional pain, body aches, and difficulty sleeping.  She was also having back spasms daily.  She was prescribed abx at that time.  Today, her incisional pain is gone, body aches are gone, but is still reporting regular back spasms.  She does not desire medication for these, and reports she is managing well. She denies urinary symptoms, h/a, dizziness, n/v, or fever/chills.  was tx with abx.  O: BP 113/64  Pulse 68  Temp(Src) 97.3 F (36.3 C) (Oral)  Resp 16  Ht 5\' 4"  (1.626 m)  Wt 181 lb (82.101 kg)  BMI 31.05 kg/m2  Breastfeeding? Yes  Physical Examination: General appearance - alert, well appearing, and in no distress and oriented to person, place, and time Abdomen - mild tenderness noted below umbilicus, none at incision site bowel sounds normal Incision well approximated, no erythema, no exudate  A: 1. Postoperative pain   2. Visit for wound check   3. 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

## 2013-03-20 ENCOUNTER — Encounter: Payer: Self-pay | Admitting: Advanced Practice Midwife

## 2013-03-20 ENCOUNTER — Other Ambulatory Visit (HOSPITAL_COMMUNITY)
Admission: RE | Admit: 2013-03-20 | Discharge: 2013-03-20 | Disposition: A | Discharge status: Home / Self Care | Payer: Medicaid Other | Source: Ambulatory Visit | Attending: Obstetrics & Gynecology | Admitting: Obstetrics & Gynecology

## 2013-03-20 ENCOUNTER — Ambulatory Visit (INDEPENDENT_AMBULATORY_CARE_PROVIDER_SITE_OTHER): Payer: Medicaid Other | Admitting: Advanced Practice Midwife

## 2013-03-20 VITALS — BP 117/74 | Wt 187.0 lb

## 2013-03-20 DIAGNOSIS — R87810 Cervical high risk human papillomavirus (HPV) DNA test positive: Secondary | ICD-10-CM | POA: Insufficient documentation

## 2013-03-20 DIAGNOSIS — O26899 Other specified pregnancy related conditions, unspecified trimester: Secondary | ICD-10-CM

## 2013-03-20 DIAGNOSIS — O24419 Gestational diabetes mellitus in pregnancy, unspecified control: Secondary | ICD-10-CM

## 2013-03-20 DIAGNOSIS — R87619 Unspecified abnormal cytological findings in specimens from cervix uteri: Secondary | ICD-10-CM | POA: Insufficient documentation

## 2013-03-20 DIAGNOSIS — F411 Generalized anxiety disorder: Secondary | ICD-10-CM

## 2013-03-20 DIAGNOSIS — Z1151 Encounter for screening for human papillomavirus (HPV): Secondary | ICD-10-CM | POA: Insufficient documentation

## 2013-03-20 DIAGNOSIS — O9989 Other specified diseases and conditions complicating pregnancy, childbirth and the puerperium: Secondary | ICD-10-CM

## 2013-03-20 DIAGNOSIS — O3421 Maternal care for scar from previous cesarean delivery: Secondary | ICD-10-CM

## 2013-03-20 DIAGNOSIS — Z8632 Personal history of gestational diabetes: Secondary | ICD-10-CM

## 2013-03-20 DIAGNOSIS — G8929 Other chronic pain: Secondary | ICD-10-CM | POA: Insufficient documentation

## 2013-03-20 DIAGNOSIS — Z3480 Encounter for supervision of other normal pregnancy, unspecified trimester: Secondary | ICD-10-CM

## 2013-03-20 DIAGNOSIS — O99810 Abnormal glucose complicating pregnancy: Secondary | ICD-10-CM

## 2013-03-20 DIAGNOSIS — O99891 Other specified diseases and conditions complicating pregnancy: Secondary | ICD-10-CM

## 2013-03-20 DIAGNOSIS — O34219 Maternal care for unspecified type scar from previous cesarean delivery: Secondary | ICD-10-CM

## 2013-03-20 DIAGNOSIS — Z124 Encounter for screening for malignant neoplasm of cervix: Secondary | ICD-10-CM | POA: Insufficient documentation

## 2013-03-20 DIAGNOSIS — F419 Anxiety disorder, unspecified: Secondary | ICD-10-CM | POA: Insufficient documentation

## 2013-03-20 DIAGNOSIS — M549 Dorsalgia, unspecified: Secondary | ICD-10-CM

## 2013-03-20 DIAGNOSIS — O09292 Supervision of pregnancy with other poor reproductive or obstetric history, second trimester: Secondary | ICD-10-CM | POA: Insufficient documentation

## 2013-03-20 MED ORDER — ACCU-CHEK FASTCLIX LANCETS MISC
1.0000 [IU] | Freq: Four times a day (QID) | Status: DC
Start: 2013-03-20 — End: 2013-04-21

## 2013-03-20 MED ORDER — PRENATAL VITAMINS 28-0.8 MG PO TABS: 1.0000 | ORAL_TABLET | Freq: Every day | ORAL | Status: DC

## 2013-03-20 MED ORDER — ACCU-CHEK NANO SMARTVIEW W/DEVICE KIT
1.0000 | PACK | Freq: Once | Status: DC
Start: 2013-03-20 — End: 2013-08-25

## 2013-03-20 MED ORDER — SERTRALINE HCL 50 MG PO TABS: 100.0000 mg | ORAL_TABLET | Freq: Every day | ORAL | Status: DC

## 2013-03-20 MED ORDER — GLUCOSE BLOOD VI STRP: ORAL_STRIP | Status: DC

## 2013-03-20 MED ORDER — SERTRALINE HCL 50 MG PO TABS: 50.0000 mg | ORAL_TABLET | Freq: Every day | ORAL | Status: DC

## 2013-03-20 NOTE — Progress Notes (Signed)
p-87  Bedside U/S showed GA of 11 w and FHT of 178 BPM  CRL 40mm
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
## 2013-03-21 LAB — PRESCRIPTION MONITORING PROFILE (19 PANEL)
AMPHETAMINE/METH: NEGATIVE ng/mL
BARBITURATE SCREEN, URINE: NEGATIVE ng/mL
BENZODIAZEPINE SCREEN, URINE: NEGATIVE ng/mL
Buprenorphine, Urine: NEGATIVE ng/mL
CREATININE, URINE: 189.18 mg/dL (ref >20.0)
Cannabinoid Scrn, Ur: NEGATIVE ng/mL
Carisoprodol, Urine: NEGATIVE ng/mL
Cocaine Metabolites: NEGATIVE ng/mL
Fentanyl, Ur: NEGATIVE ng/mL
MDMA URINE: NEGATIVE ng/mL
Meperidine, Ur: NEGATIVE ng/mL
Methadone Screen, Urine: NEGATIVE ng/mL
Methaqualone: NEGATIVE ng/mL
NITRITES URINE, INITIAL: NEGATIVE ug/mL
OPIATE SCREEN, URINE: NEGATIVE ng/mL
OXYCODONE SCRN UR: NEGATIVE ng/mL
PH URINE, INITIAL: 5.8 pH (ref 4.5–8.9)
PROPOXYPHENE: NEGATIVE ng/mL
Phencyclidine, Ur: NEGATIVE ng/mL
TAPENTADOLUR: NEGATIVE ng/mL
TRAMADOL UR: NEGATIVE ng/mL
ZOLPIDEM, URINE: NEGATIVE ng/mL

## 2013-03-21 LAB — GC/CHLAMYDIA PROBE AMP
CT Probe RNA: NEGATIVE
GC PROBE AMP APTIMA: NEGATIVE

## 2013-03-22 LAB — CULTURE, OB URINE
Colony Count: NO GROWTH
Organism ID, Bacteria: NO GROWTH

## 2013-03-24 LAB — US OB COMP LESS 14 WKS

## 2013-04-02 ENCOUNTER — Ambulatory Visit (HOSPITAL_COMMUNITY): Payer: Medicaid Other | Admitting: Psychiatry

## 2013-04-14 ENCOUNTER — Encounter (HOSPITAL_COMMUNITY): Payer: Self-pay | Admitting: Psychiatry

## 2013-04-14 ENCOUNTER — Ambulatory Visit (INDEPENDENT_AMBULATORY_CARE_PROVIDER_SITE_OTHER): Payer: Medicaid Other | Admitting: Psychiatry

## 2013-04-14 DIAGNOSIS — F419 Anxiety disorder, unspecified: Secondary | ICD-10-CM

## 2013-04-14 DIAGNOSIS — F411 Generalized anxiety disorder: Secondary | ICD-10-CM

## 2013-04-14 DIAGNOSIS — F410 Panic disorder [episodic paroxysmal anxiety] without agoraphobia: Secondary | ICD-10-CM
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
## 2013-04-21 ENCOUNTER — Encounter: Payer: Self-pay | Admitting: Advanced Practice Midwife

## 2013-04-21 ENCOUNTER — Ambulatory Visit (INDEPENDENT_AMBULATORY_CARE_PROVIDER_SITE_OTHER): Payer: Medicaid Other | Admitting: Psychiatry

## 2013-04-21 ENCOUNTER — Ambulatory Visit (INDEPENDENT_AMBULATORY_CARE_PROVIDER_SITE_OTHER): Payer: Medicaid Other | Admitting: Advanced Practice Midwife

## 2013-04-21 VITALS — BP 121/72 | Wt 179.0 lb

## 2013-04-21 DIAGNOSIS — F410 Panic disorder [episodic paroxysmal anxiety] without agoraphobia: Secondary | ICD-10-CM

## 2013-04-21 DIAGNOSIS — B3731 Acute candidiasis of vulva and vagina: Secondary | ICD-10-CM

## 2013-04-21 DIAGNOSIS — O09299 Supervision of pregnancy with other poor reproductive or obstetric history, unspecified trimester: Secondary | ICD-10-CM

## 2013-04-21 DIAGNOSIS — F411 Generalized anxiety disorder: Secondary | ICD-10-CM

## 2013-04-21 DIAGNOSIS — Z8632 Personal history of gestational diabetes: Secondary | ICD-10-CM

## 2013-04-21 DIAGNOSIS — O99810 Abnormal glucose complicating pregnancy: Secondary | ICD-10-CM

## 2013-04-21 DIAGNOSIS — B373 Candidiasis of vulva and vagina: Secondary | ICD-10-CM

## 2013-04-21 DIAGNOSIS — R6889 Other general symptoms and signs: Secondary | ICD-10-CM

## 2013-04-21 DIAGNOSIS — IMO0002 Reserved for concepts with insufficient information to code with codable children: Secondary | ICD-10-CM

## 2013-04-21 DIAGNOSIS — O09292 Supervision of pregnancy with other poor reproductive or obstetric history, second trimester: Secondary | ICD-10-CM

## 2013-04-21 DIAGNOSIS — F419 Anxiety disorder, unspecified: Secondary | ICD-10-CM

## 2013-04-21 MED ORDER — GLUCOSE BLOOD VI STRP: ORAL_STRIP | Status: DC

## 2013-04-21 MED ORDER — TERCONAZOLE 0.4 % VA CREA
1.0000 | TOPICAL_CREAM | Freq: Every day | VAGINAL | Status: DC
Start: 2013-04-21 — End: 2013-08-25

## 2013-04-21 MED ORDER — ACCU-CHEK FASTCLIX LANCETS MISC: 1.0000 [IU] | Freq: Four times a day (QID) | Status: DC

## 2013-04-21 MED ORDER — GLYBURIDE 2.5 MG PO TABS: 2.5000 mg | ORAL_TABLET | Freq: Two times a day (BID) | ORAL | Status: DC

## 2013-04-21 NOTE — Progress Notes (Signed)
P-95 

## 2013-04-21 NOTE — Progress Notes (Signed)
THERAPIST PROGRESS NOTE  Session Time: 2:00-2:50 pm  Participation Level: Active  Behavioral Response: CasualAlertAnxious  Type of Therapy: Individual Therapy  Treatment Goals addressed: Anxiety  Interventions: Created treatment plan goals  Summary   INDIVIDUAL TREATMENT PLAN       Date of Admission:  04-14-13 Date of Treatment Plan: 04-21-13     Service: [x]  Group  [x]  Individual [x]  Comprehensive []  Revised due to: []  Change in Diagnosis     []  Change in Service     []  Expiration of Previous Treatment Plan  Diagnosis:  Panic Disorder and Generalized Anxiety Disorder  Recommended Treatment:  [x]  Individual Therapy  []  Family Therapy  [x]  Couples Therapy  [x]  Chemical Dependency (CD-IOP) group therapy 3x weekly and 1:1 individual therapy as required  []  Mental Health (MH-IOP) group therapy 5x weekly and 1:1 individual therapy as required   Possible Negative Outcomes of Treatment: Symptoms of mental illness may increase and changes in relationship can occur during the course of treatment.  Client's Strengths (What are the strengths and resources that will help clients move towards their goals):  Supportive Husband, educated and motivated   Personal Recovery Goal(s)/Client's Goals for Treatment (use client's words): Be able to manage fears of needles for blood draws and prepare for medical procedures associated with labor    Objective Behavioral Criteria for Discharge: Client will maintain stability in the community as demonstrated by the following:   No suicidal behaviors for 3 months.   No hospitalizations or emergency room visits for psychological reasons for 3 months.   No SIB behaviors requiring medical attention for 3 months.   Emotional regulation by reporting distress at an average of 5/10 or below for 3 months.   Demonstration of healthy community supports by initiating peer contact at least twice weekly separate from the treatment environment.   Agreed-upon  transition plan to a less restrictive setting.  INFORMED CONSENT TO TREATMENT. I acknowledge that I have been informed of and am able to understand my diagnosis, the nature and purpose of the proposed treatment, the risks and benefits of the proposed treatment, the possible negative outcomes of and possible alternatives to the proposed treatment, the probability that the proposed treatment will be successful, and the prognosis if I choose not to receive the treatment. Further, I have been informed of the extent and limits of confidentiality of treatment information. I understand the risks, benefits, possible negative outcomes of and alternatives to this proposed treatment, and my signature below verifies that I have actively participated in the development of my treatment plan and that I am willingly and voluntarily agreeing to the treatment outlined in this plan.   Assessed Needs (Problem) Client's Goals (what client will do)   Interventions (what staff will do)   1. Panic Disorder    o Mange anxiety and panic symptoms associated with medical procedures and blood draws.   o Weekly individual therapy. o Provide education about panic disorder. o Teach Biofeedback and meditation techniques for anxiety and panic symptoms. o Assign homework to practice using biofeedback techniques. o Teach DBT Distress Tolerance skills to help Pt lean anxiety management skills. o Teach CBT skills to help Pt create a behavior management plan to help prepare more effectively for doctors' visits and surgeries and to challenge Pts negative, distorted thinking about medical providers.  o Collaborate with Pt and her husband to create a medical support plan.   2. Fear of Needles     o Mange anxiety and panic  symptoms associated with needles.  o Process feelings of fear associated with needles and  medical procedures. o Encourage sharing of past traumas from medical procedures. o Provide education on phobias for a  better understanding of the condition.  o Teach coping skills to manage anxiety symptoms such as; biofeedback, guided imagery, meditation and aromatherapy. o Use CBT to identify and change anxiety provoking thought patterns.   3. Fear of Labor    o Mange anxiety and panic symptoms associated with labor.  o Process fears associated with upcoming labor. o Teach coping skills of anxiety management. o Create and anxiety labor plan.   I have [] received [] declined a copy of this treatment plan.  Client: Date: Guardian/Parent: Date:   Therapist: Date: Licensed Provider (if applicable): Date:    Six month review:   I have reviewed this treatment plan and consider it still valid. Client: Date: Guardian/Parent: Date:      Suicidal/Homicidal: Nowithout intent/plan  Therapist Response: Created treatment plan goals  Plan: Return again in 1 weeks.  Diagnosis: Axis I: Panic Disorder      Carman ChingGLEHORN,Yadiel Aubry E, LCSW 04/21/2013

## 2013-04-21 NOTE — Progress Notes (Signed)
-  Fasting CBGs 158, 119, 134, 140. Post Prandial 120, 110, 115. Glyburide 2.5 mg qam and 5 mg qhs per consult w/ Dr. Marice Potterove. Diabetes educator referal did not go through. Will re-request. Recommended baseline 24 hour urine, CBC, CMET per Dr. Marice Potterove. Pt declines blood work. Will order P:C ratio for now, but strongly recommended the the full set of labs.  -Reviewed ASCUS Pap, pos HRHPV. Scheduled colpo.   -Started Manufacturing engineerseeing counselor. Very helpful. -Requesting PT referral for pelvic pain and  Instability. Request sent.  -Reports cramping after sex. None otherwise. No bleeding, urinary complaints, vaginal discharge. Declines exam. Recommended increase water, and pelvic rest x 1 week. MAU PRN. Will check UA when pt able to void.

## 2013-04-22 LAB — PROTEIN / CREATININE RATIO, URINE
CREATININE, URINE: 142.6 mg/dL
Protein Creatinine Ratio: 0.07 (ref <0.15)
TOTAL PROTEIN, URINE: 10 mg/dL

## 2013-04-23 ENCOUNTER — Telehealth: Payer: Self-pay | Admitting: *Deleted

## 2013-04-23 DIAGNOSIS — O99810 Abnormal glucose complicating pregnancy: Secondary | ICD-10-CM

## 2013-04-23 DIAGNOSIS — IMO0002 Reserved for concepts with insufficient information to code with codable children: Secondary | ICD-10-CM

## 2013-04-23 MED ORDER — GLYBURIDE 2.5 MG PO TABS: 2.5000 mg | ORAL_TABLET | Freq: Two times a day (BID) | ORAL | Status: DC

## 2013-04-23 NOTE — Telephone Encounter (Signed)
Pharm called to adv glyburide qty was incorrect if pt was to take 3 pills each day. I have updated medication qty to provide enough for 1 month supply.

## 2013-04-28 ENCOUNTER — Ambulatory Visit (INDEPENDENT_AMBULATORY_CARE_PROVIDER_SITE_OTHER): Payer: Medicaid Other | Admitting: Psychiatry

## 2013-04-28 ENCOUNTER — Encounter (INDEPENDENT_AMBULATORY_CARE_PROVIDER_SITE_OTHER): Payer: Self-pay

## 2013-04-28 DIAGNOSIS — F419 Anxiety disorder, unspecified: Secondary | ICD-10-CM

## 2013-04-28 DIAGNOSIS — F410 Panic disorder [episodic paroxysmal anxiety] without agoraphobia: Secondary | ICD-10-CM

## 2013-04-28 DIAGNOSIS — F411 Generalized anxiety disorder: Secondary | ICD-10-CM

## 2013-04-28 NOTE — Progress Notes (Signed)
   THERAPIST PROGRESS NOTE  Session Time: 3:00-3:50 pm  Participation Level: Active  Behavioral Response: CasualAlertEuthymic  Type of Therapy: Individual Therapy  Treatment Goals addressed: Panic Disorder and Fear of Neeles  Interventions: CBT and Biofeedback  Summary: Kathryn Peterson is a 33 y.o. female who presents with mild anxious mood and calm affect. Pt said she feels incredible relief after learning last week at her OBGYN office that they do not feel she needs the required blood work for this stage of pregnancy due to running an alterative urine test that came back ok. Pt processed this immense relief and said she has an appointment next week to confirm this with her doctor. Pt said if they try to encourage her to get the blood work she will take down the information on why they recommended further testing to discuss same day during her counseling session with Clinical research associatewriter. Pt said for the first time in counseling she feels optimistic that she can learn tools to help her better manage her panic symptoms to needles. Pt requested to start by learning the biofeedback technique during this session as her primary stress management tool.  Pt identified what stress feels like physiologically during a panic attack and then what it felt like during session. Pt rated her stress level a 3/10 with 10 being high stress and said she feels relatively calm today due to not having to have the blood work. [facility] and reported a decrease in stress in two minutes of learning the breathing technique. Pt agreed to bring her husband next week to introduce him to biofeedback as well so he can help Pt use this during blood work procedures. Pt agreed to download a free breathing pacer and practice one time per day until next session.   Suicidal/Homicidal:No  Therapist Response: Assessed overall level of anxiety per Pt self report and assessed panic symptoms since last session, reviewed causes  for improved mood and decreased panic, used CBT to educate Pt on how to better manage anxiety provoking thoughts at he doctors appointment next week, Signed treatment plan and introduced Pt to biofeedback by drawing her the diagram of the heart brain connection with cortisol and teaching her only step 1) Hearth Breathing. Pt practiced on the coherence coach for two minutes and went from high stress to high calm (91 %) in that timeframe. Pt reported a decrease in stress in that time.    Plan: Return again in 1 week. Pts husband will attend next session, Review heart breathing step of biofeedback and introduce heart focus.   Diagnosis: Axis I: Panic Disorder and Generalized Anxiety Disorder       Carman ChingGLEHORN,Sanjuana Mruk E, LCSW 04/28/2013

## 2013-05-05 ENCOUNTER — Other Ambulatory Visit: Payer: Self-pay | Admitting: Advanced Practice Midwife

## 2013-05-05 ENCOUNTER — Ambulatory Visit (INDEPENDENT_AMBULATORY_CARE_PROVIDER_SITE_OTHER): Payer: Medicaid Other | Admitting: Advanced Practice Midwife

## 2013-05-05 ENCOUNTER — Ambulatory Visit (HOSPITAL_COMMUNITY): Payer: Medicaid Other | Admitting: Psychiatry

## 2013-05-05 VITALS — BP 110/67 | Wt 181.0 lb

## 2013-05-05 DIAGNOSIS — O24419 Gestational diabetes mellitus in pregnancy, unspecified control: Secondary | ICD-10-CM

## 2013-05-05 DIAGNOSIS — IMO0002 Reserved for concepts with insufficient information to code with codable children: Secondary | ICD-10-CM

## 2013-05-05 DIAGNOSIS — O09299 Supervision of pregnancy with other poor reproductive or obstetric history, unspecified trimester: Secondary | ICD-10-CM

## 2013-05-05 DIAGNOSIS — O99810 Abnormal glucose complicating pregnancy: Secondary | ICD-10-CM

## 2013-05-05 DIAGNOSIS — Z0489 Encounter for examination and observation for other specified reasons: Secondary | ICD-10-CM

## 2013-05-05 DIAGNOSIS — Z8632 Personal history of gestational diabetes: Secondary | ICD-10-CM

## 2013-05-05 DIAGNOSIS — Z794 Long term (current) use of insulin: Secondary | ICD-10-CM

## 2013-05-05 NOTE — Progress Notes (Signed)
p 

## 2013-05-05 NOTE — Addendum Note (Signed)
Addended by: LEFTWICH-KIRBY, Mariana Goytia A on: 05/05/2013 10:25 AM   Modules accepted: Level of Service  

## 2013-05-05 NOTE — Progress Notes (Signed)
Doing well.  Good fetal movement, denies vaginal bleeding, LOF, cramping/contractions.  Pt reports fasting CBGs in 60s-70s and pt symptomatic after started Glyburide 5 mg Q hs, so started taking 1.5 tablets (3.75 mg) and fastings CBGs all 80s x1 week.  Pt postprandial CBGs 110-125 x1 week.  Colpo scheduled for ASCUS pap.  Reviewed normal P/C ratio (0.07) with pt. Pt declined 24 hour urine and blood labs r/t needle phobia. She is working with counselor at this time and plans to do 28 week labs but needs more time to work on anxiety.

## 2013-05-12 ENCOUNTER — Ambulatory Visit (INDEPENDENT_AMBULATORY_CARE_PROVIDER_SITE_OTHER): Payer: 59 | Admitting: Psychiatry

## 2013-05-12 ENCOUNTER — Ambulatory Visit (HOSPITAL_COMMUNITY)
Admission: RE | Admit: 2013-05-12 | Discharge: 2013-05-12 | Disposition: A | Discharge status: Home / Self Care | Payer: Medicaid Other | Source: Ambulatory Visit | Attending: Obstetrics & Gynecology | Admitting: Obstetrics & Gynecology

## 2013-05-12 DIAGNOSIS — Z0489 Encounter for examination and observation for other specified reasons: Secondary | ICD-10-CM

## 2013-05-12 DIAGNOSIS — O352XX0 Maternal care for (suspected) hereditary disease in fetus, not applicable or unspecified: Secondary | ICD-10-CM | POA: Insufficient documentation

## 2013-05-12 DIAGNOSIS — Z363 Encounter for antenatal screening for malformations: Secondary | ICD-10-CM | POA: Insufficient documentation

## 2013-05-12 DIAGNOSIS — F410 Panic disorder [episodic paroxysmal anxiety] without agoraphobia: Secondary | ICD-10-CM

## 2013-05-12 DIAGNOSIS — Z1389 Encounter for screening for other disorder: Secondary | ICD-10-CM | POA: Insufficient documentation

## 2013-05-12 DIAGNOSIS — IMO0002 Reserved for concepts with insufficient information to code with codable children: Secondary | ICD-10-CM

## 2013-05-12 DIAGNOSIS — F419 Anxiety disorder, unspecified: Secondary | ICD-10-CM

## 2013-05-12 DIAGNOSIS — O337XX0 Maternal care for disproportion due to other fetal deformities, not applicable or unspecified: Secondary | ICD-10-CM | POA: Insufficient documentation

## 2013-05-12 DIAGNOSIS — O358XX0 Maternal care for other (suspected) fetal abnormality and damage, not applicable or unspecified: Secondary | ICD-10-CM | POA: Insufficient documentation

## 2013-05-12 DIAGNOSIS — O09299 Supervision of pregnancy with other poor reproductive or obstetric history, unspecified trimester: Secondary | ICD-10-CM

## 2013-05-12 DIAGNOSIS — Z8632 Personal history of gestational diabetes: Secondary | ICD-10-CM

## 2013-05-12 DIAGNOSIS — F411 Generalized anxiety disorder: Secondary | ICD-10-CM

## 2013-05-12 DIAGNOSIS — O34219 Maternal care for unspecified type scar from previous cesarean delivery: Secondary | ICD-10-CM | POA: Insufficient documentation

## 2013-05-12 DIAGNOSIS — O24919 Unspecified diabetes mellitus in pregnancy, unspecified trimester: Secondary | ICD-10-CM | POA: Insufficient documentation

## 2013-05-12 NOTE — Progress Notes (Signed)
   THERAPIST PROGRESS NOTE  Session Time: 3:00-3:50 pm  Participation Level: Active  Behavioral Response: CasualAlertEuthymic  Type of Therapy: Family Therapy  Treatment Goals addressed:  Panic Disorder  Interventions: Solution Focused, Biofeedback and Supportive  Summary: Leonard DowningJennifer Naji is a 33 y.o. female who presents with her husband to learn an anxiety coping technique to better manage anxiety associated with upcoming blood draws for her preganancy. Pt said she learned today they are having a boy and processed feelings about learning the gender. Pt reviewed her homework from previous week and showed writer the app she has been using to pace her breathing that she downloaded. Pt said she can feel a difference in her calm level following the breathing activity. Pt logged onto the biofeedback program and practiced the neutral technique and then practiced focusing on her stress and brining herself back to the neutral technique. Pt was very successful at making this shift. Pts husband learned the technique and also practiced logging on and found the same affect. Both reported feeling more calm following the activity. Pt was introduced to the feeling component of the technique and agreed to practice that this week.   Suicidal/Homicidal: No  Therapist Response: Assessed overall level of functioning per pt self report, processed feelings associated with pregnancy and procedures associated with pregnancy, reviewed homework, introduced husband to biofeedback, practiced the neutral technique and introduced the feeling component. Assigned homework of practicing all three steps before next week.   Plan: Return again in 1 week. Continue teaching biofeedback for stress management.   Diagnosis: Axis I: panic disorder       Carman ChingGLEHORN,Taneah Masri E, LCSW 05/12/2013

## 2013-05-15 ENCOUNTER — Encounter: Payer: Self-pay | Admitting: Obstetrics & Gynecology

## 2013-05-19 ENCOUNTER — Ambulatory Visit (HOSPITAL_COMMUNITY): Payer: Medicaid Other | Admitting: Psychiatry

## 2013-05-19 ENCOUNTER — Encounter: Payer: Self-pay | Admitting: Advanced Practice Midwife

## 2013-05-22 ENCOUNTER — Other Ambulatory Visit: Payer: Self-pay | Admitting: Obstetrics & Gynecology

## 2013-05-22 ENCOUNTER — Ambulatory Visit (INDEPENDENT_AMBULATORY_CARE_PROVIDER_SITE_OTHER): Payer: Medicaid Other | Admitting: Obstetrics & Gynecology

## 2013-05-22 ENCOUNTER — Encounter: Payer: Self-pay | Admitting: Obstetrics & Gynecology

## 2013-05-22 VITALS — BP 117/67 | HR 92 | Resp 16 | Ht 66.0 in | Wt 188.0 lb

## 2013-05-22 DIAGNOSIS — R87610 Atypical squamous cells of undetermined significance on cytologic smear of cervix (ASC-US): Secondary | ICD-10-CM

## 2013-05-22 DIAGNOSIS — O358XX0 Maternal care for other (suspected) fetal abnormality and damage, not applicable or unspecified: Secondary | ICD-10-CM

## 2013-05-22 DIAGNOSIS — IMO0002 Reserved for concepts with insufficient information to code with codable children: Secondary | ICD-10-CM

## 2013-05-22 DIAGNOSIS — O24919 Unspecified diabetes mellitus in pregnancy, unspecified trimester: Secondary | ICD-10-CM

## 2013-05-22 DIAGNOSIS — O337XX0 Maternal care for disproportion due to other fetal deformities, not applicable or unspecified: Secondary | ICD-10-CM

## 2013-05-22 DIAGNOSIS — O3421 Maternal care for scar from previous cesarean delivery: Secondary | ICD-10-CM

## 2013-05-22 NOTE — Progress Notes (Signed)
    GYNECOLOGY CLINIC COLPOSCOPY PROCEDURE NOTE  33 y.o. [id number] at [id number] with A2GDM here for colposcopy for ASCUS with POSITIVE high risk HPV pap smear on 03/20/13. Discussed role for HPV in cervical dysplasia, need for surveillance.  Patient given informed consent, signed copy in the chart, time out was performed.  Placed in lithotomy position. Cervix viewed with speculum and colposcope after application of acetic acid.   Colposcopy adequate? Yes No visible lesions; no biopsies obtained.    FHR 143 at the end of the procedure.  Patient was given post procedure instructions.    Continue routine prenatal care.   Jaynie CollinsUGONNA  Theressa Piedra, MD, FACOG Attending Obstetrician & Gynecologist Faculty Practice, [facility]

## 2013-05-22 NOTE — Patient Instructions (Signed)
Return to clinic for any obstetric concerns or go to [facility] for evaluation  

## 2013-05-26 ENCOUNTER — Ambulatory Visit (INDEPENDENT_AMBULATORY_CARE_PROVIDER_SITE_OTHER): Payer: 59 | Admitting: Psychiatry

## 2013-05-26 DIAGNOSIS — F410 Panic disorder [episodic paroxysmal anxiety] without agoraphobia: Secondary | ICD-10-CM

## 2013-05-26 DIAGNOSIS — F419 Anxiety disorder, unspecified: Secondary | ICD-10-CM

## 2013-05-26 DIAGNOSIS — F411 Generalized anxiety disorder: Secondary | ICD-10-CM

## 2013-05-27 NOTE — Progress Notes (Signed)
   THERAPIST PROGRESS NOTE  Session Time: 4:00-4:50 pm  Participation Level: Active  Behavioral Response: CasualAlertEuthymic  Type of Therapy: Individual Therapy  Treatment Goals addressed: Panic Disorder and Fear of Labor  Interventions: CBT, Biofeedback and Supportive  Summary: Kathryn DowningJennifer Peterson is a 33 y.o. female who presents with elevated mood and affect. Pt said she is in her twenty fourth week of pregnancy and things are going well according to her most recent doctors visit. Pt is not scheduled for any upcoming blood work, but states she used her biofeedback at her most recent office visit where they did a simple medical procedure and Pt found it helpful in managing anxiety symptoms. Pt processed feelings about her pregnancy and also discussed life changes that are taking place such as moving to a larger apartment to prepare for the birth of the third child. Pt said overall she feels optimistic about her labor process with the new tools she is learning to manager her anxiety. Pt said she is struggling with adding the feeling component of the breathing technique with watching the coherence coach and wants to work on that at the next session.    Suicidal/Homicidal: No  Therapist Response: Assessed overall level of functioning and anxiety symptoms per Pt self report, reviewed biofeedback process and its effectiveness in reducing anxiety and panic, processed feelings surrounding pregnancy and upcoming labor, explore prepeartions for the birth of the baby to reduce anxiety.  Plan: Return again in 1 week. Review the feelings component of biofeedback without using the coherence coach.   Diagnosis: Axis I: Panic Disorder and Generalized Anxiety Disorder        Kathryn Peterson,Kathryn Attwood E, LCSW 05/27/2013

## 2013-06-02 ENCOUNTER — Ambulatory Visit (HOSPITAL_COMMUNITY): Payer: Medicaid Other | Admitting: Psychiatry

## 2013-06-02 ENCOUNTER — Ambulatory Visit (INDEPENDENT_AMBULATORY_CARE_PROVIDER_SITE_OTHER): Payer: Medicaid Other | Admitting: Advanced Practice Midwife

## 2013-06-02 VITALS — BP 108/63 | Wt 188.0 lb

## 2013-06-02 DIAGNOSIS — IMO0002 Reserved for concepts with insufficient information to code with codable children: Secondary | ICD-10-CM

## 2013-06-02 DIAGNOSIS — O99810 Abnormal glucose complicating pregnancy: Secondary | ICD-10-CM

## 2013-06-02 DIAGNOSIS — O3421 Maternal care for scar from previous cesarean delivery: Secondary | ICD-10-CM

## 2013-06-02 DIAGNOSIS — R6889 Other general symptoms and signs: Secondary | ICD-10-CM

## 2013-06-02 DIAGNOSIS — O34219 Maternal care for unspecified type scar from previous cesarean delivery: Secondary | ICD-10-CM

## 2013-06-02 DIAGNOSIS — O09899 Supervision of other high risk pregnancies, unspecified trimester: Secondary | ICD-10-CM

## 2013-06-02 NOTE — Patient Instructions (Signed)
Second Trimester of Pregnancy The second trimester is from week 13 through week 28, months 4 through 6. The second trimester is often a time when you feel your best. Your body has also adjusted to being pregnant, and you begin to feel better physically. Usually, morning sickness has lessened or quit completely, you may have more energy, and you may have an increase in appetite. The second trimester is also a time when the fetus is growing rapidly. At the end of the sixth month, the fetus is about 9 inches long and weighs about 1 pounds. You will likely begin to feel the baby move (quickening) between 18 and 20 weeks of the pregnancy. BODY CHANGES Your body goes through many changes during pregnancy. The changes vary from woman to woman.   Your weight will continue to increase. You will notice your lower abdomen bulging out.  You may begin to get stretch marks on your hips, abdomen, and breasts.  You may develop headaches that can be relieved by medicines approved by your caregiver.  You may urinate more often because the fetus is pressing on your bladder.  You may develop or continue to have heartburn as a result of your pregnancy.  You may develop constipation because certain hormones are causing the muscles that push waste through your intestines to slow down.  You may develop hemorrhoids or swollen, bulging veins (varicose veins).  You may have back pain because of the weight gain and pregnancy hormones relaxing your joints between the bones in your pelvis and as a result of a shift in weight and the muscles that support your balance.  Your breasts will continue to grow and be tender.  Your gums may bleed and may be sensitive to brushing and flossing.  Dark spots or blotches (chloasma, mask of pregnancy) may develop on your face. This will likely fade after the baby is born.  A dark line from your belly button to the pubic area (linea nigra) may appear. This will likely fade after the  baby is born. WHAT TO EXPECT AT YOUR PRENATAL VISITS During a routine prenatal visit:  You will be weighed to make sure you and the fetus are growing normally.  Your blood pressure will be taken.  Your abdomen will be measured to track your baby's growth.  The fetal heartbeat will be listened to.  Any test results from the previous visit will be discussed. Your caregiver may ask you:  How you are feeling.  If you are feeling the baby move.  If you have had any abnormal symptoms, such as leaking fluid, bleeding, severe headaches, or abdominal cramping.  If you have any questions. Other tests that may be performed during your second trimester include:  Blood tests that check for:  Low iron levels (anemia).  Gestational diabetes (between 24 and 28 weeks).  Rh antibodies.  Urine tests to check for infections, diabetes, or protein in the urine.  An ultrasound to confirm the proper growth and development of the baby.  An amniocentesis to check for possible genetic problems.  Fetal screens for spina bifida and Down syndrome. HOME CARE INSTRUCTIONS   Avoid all smoking, herbs, alcohol, and unprescribed drugs. These chemicals affect the formation and growth of the baby.  Follow your caregiver's instructions regarding medicine use. There are medicines that are either safe or unsafe to take during pregnancy.  Exercise only as directed by your caregiver. Experiencing uterine cramps is a good sign to stop exercising.  Continue to eat regular,   healthy meals.  Wear a good support bra for breast tenderness.  Do not use hot tubs, steam rooms, or saunas.  Wear your seat belt at all times when driving.  Avoid raw meat, uncooked cheese, cat litter boxes, and soil used by cats. These carry germs that can cause birth defects in the baby.  Take your prenatal vitamins.  Try taking a stool softener (if your caregiver approves) if you develop constipation. Eat more high-fiber foods,  such as fresh vegetables or fruit and whole grains. Drink plenty of fluids to keep your urine clear or pale yellow.  Take warm sitz baths to soothe any pain or discomfort caused by hemorrhoids. Use hemorrhoid cream if your caregiver approves.  If you develop varicose veins, wear support hose. Elevate your feet for 15 minutes, 3 4 times a day. Limit salt in your diet.  Avoid heavy lifting, wear low heel shoes, and practice good posture.  Rest with your legs elevated if you have leg cramps or low back pain.  Visit your dentist if you have not gone yet during your pregnancy. Use a soft toothbrush to brush your teeth and be gentle when you floss.  A sexual relationship may be continued unless your caregiver directs you otherwise.  Continue to go to all your prenatal visits as directed by your caregiver. SEEK MEDICAL CARE IF:   You have dizziness.  You have mild pelvic cramps, pelvic pressure, or nagging pain in the abdominal area.  You have persistent nausea, vomiting, or diarrhea.  You have a bad smelling vaginal discharge.  You have pain with urination. SEEK IMMEDIATE MEDICAL CARE IF:   You have a fever.  You are leaking fluid from your vagina.  You have spotting or bleeding from your vagina.  You have severe abdominal cramping or pain.  You have rapid weight gain or loss.  You have shortness of breath with chest pain.  You notice sudden or extreme swelling of your face, hands, ankles, feet, or legs.  You have not felt your baby move in over an hour.  You have severe headaches that do not go away with medicine.  You have vision changes. Document Released: 01/24/2001 Document Revised: 10/02/2012 Document Reviewed: 04/02/2012 [employer] Patient Information 2014 [employer].  

## 2013-06-02 NOTE — Progress Notes (Signed)
Colpo done. No Bx. Fasting CBGs 81-96. 2 hour PP 102-115. Echo done and normal per pt. Record not scanned.

## 2013-06-02 NOTE — Progress Notes (Signed)
P - 81 

## 2013-06-09 ENCOUNTER — Ambulatory Visit (HOSPITAL_COMMUNITY)
Admission: RE | Admit: 2013-06-09 | Discharge: 2013-06-09 | Disposition: A | Discharge status: Home / Self Care | Payer: Medicaid Other | Source: Ambulatory Visit | Attending: Obstetrics & Gynecology | Admitting: Obstetrics & Gynecology

## 2013-06-09 ENCOUNTER — Other Ambulatory Visit: Payer: Self-pay | Admitting: Obstetrics & Gynecology

## 2013-06-09 ENCOUNTER — Ambulatory Visit (INDEPENDENT_AMBULATORY_CARE_PROVIDER_SITE_OTHER): Payer: 59 | Admitting: Psychiatry

## 2013-06-09 DIAGNOSIS — O24919 Unspecified diabetes mellitus in pregnancy, unspecified trimester: Secondary | ICD-10-CM

## 2013-06-09 DIAGNOSIS — F411 Generalized anxiety disorder: Secondary | ICD-10-CM

## 2013-06-09 DIAGNOSIS — O337XX0 Maternal care for disproportion due to other fetal deformities, not applicable or unspecified: Secondary | ICD-10-CM | POA: Insufficient documentation

## 2013-06-09 DIAGNOSIS — O3421 Maternal care for scar from previous cesarean delivery: Secondary | ICD-10-CM

## 2013-06-09 DIAGNOSIS — O34219 Maternal care for unspecified type scar from previous cesarean delivery: Secondary | ICD-10-CM | POA: Insufficient documentation

## 2013-06-09 DIAGNOSIS — O358XX0 Maternal care for other (suspected) fetal abnormality and damage, not applicable or unspecified: Secondary | ICD-10-CM

## 2013-06-09 DIAGNOSIS — F419 Anxiety disorder, unspecified: Secondary | ICD-10-CM

## 2013-06-09 DIAGNOSIS — F410 Panic disorder [episodic paroxysmal anxiety] without agoraphobia: Secondary | ICD-10-CM
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
## 2013-06-16 ENCOUNTER — Ambulatory Visit (HOSPITAL_COMMUNITY): Payer: Self-pay | Admitting: Psychiatry

## 2013-06-16 ENCOUNTER — Ambulatory Visit (INDEPENDENT_AMBULATORY_CARE_PROVIDER_SITE_OTHER): Payer: 59 | Admitting: Psychiatry

## 2013-06-16 DIAGNOSIS — F410 Panic disorder [episodic paroxysmal anxiety] without agoraphobia: Secondary | ICD-10-CM

## 2013-06-16 DIAGNOSIS — F419 Anxiety disorder, unspecified: Secondary | ICD-10-CM

## 2013-06-16 DIAGNOSIS — F411 Generalized anxiety disorder: Secondary | ICD-10-CM

## 2013-06-16 NOTE — Progress Notes (Signed)
   THERAPIST PROGRESS NOTE  Session Time: 8:00-8:50 am   Participation Level: Active  Behavioral Response: CasualAlertAnxious  Type of Therapy: Individual Therapy  Treatment Goals addressed:Panic Disorder and Fear of Needles  Interventions: CBT and Biofeedback  Summary: Kathryn DowningJennifer Towery is a 33 y.o. female who presents with moderate anxious mood and affect. Pt described having life stressors that she is managing in addition to preparing for her upcoming blood draw in five weeks. Pt processed feelings associated with them and identified ways she can manage them more effectively. Pt said he continues to practice her biofeedback daily in he evenings before bed and is finding it helpful. Pt logged onto the biofeedback program during session. Pts initial score was 100% stress, but she was able to quickly get into a coherence state using the quick coherence technique. Pt reported less stress following this. Pt practiced adding in the feeling component and said that helped her feel even more calm when she thought about how safe she feels around her husband and the love he has for her. Pt agreed to practice this technique once in the evening and also after a daily stressor to practice using it while in a stressful situation.  Suicidal/Homicidal: No  Therapist Response: Assessed overall level of anxiety per Pt self report and through the biofeedback program, processed life stressors and explored behavioral ways of managing them more effectively.  Practiced the biofeedback technique and the feeling component to gain increased coherence. Discussed next week preparing more for the blood draw by using he biofeedback program with some desternalization skills of preparing for the actual day of the blood draw.   Plan: Return again in 1 weeks. Practice the biofeedback with some elements from the day of the blood draw to see how Pt handles redirecting away from stressful stimulus.   Diagnosis: Axis I: Panic  Disorder and Generalized Anxiety Disorder      Carman ChingGLEHORN,Dudley Mages E, LCSW 06/16/2013

## 2013-06-23 ENCOUNTER — Ambulatory Visit (HOSPITAL_COMMUNITY): Payer: Self-pay | Admitting: Psychiatry

## 2013-06-30 ENCOUNTER — Ambulatory Visit (INDEPENDENT_AMBULATORY_CARE_PROVIDER_SITE_OTHER): Payer: 59 | Admitting: Psychiatry

## 2013-06-30 ENCOUNTER — Ambulatory Visit (INDEPENDENT_AMBULATORY_CARE_PROVIDER_SITE_OTHER): Payer: Medicaid Other | Admitting: Obstetrics and Gynecology

## 2013-06-30 ENCOUNTER — Encounter: Payer: Self-pay | Admitting: Obstetrics and Gynecology

## 2013-06-30 VITALS — BP 109/65 | HR 88 | Wt 190.0 lb

## 2013-06-30 DIAGNOSIS — F410 Panic disorder [episodic paroxysmal anxiety] without agoraphobia: Secondary | ICD-10-CM

## 2013-06-30 DIAGNOSIS — F411 Generalized anxiety disorder: Secondary | ICD-10-CM

## 2013-06-30 DIAGNOSIS — O360990 Maternal care for other rhesus isoimmunization, unspecified trimester, not applicable or unspecified: Secondary | ICD-10-CM

## 2013-06-30 DIAGNOSIS — Z6791 Unspecified blood type, Rh negative: Secondary | ICD-10-CM

## 2013-06-30 DIAGNOSIS — Z3480 Encounter for supervision of other normal pregnancy, unspecified trimester: Secondary | ICD-10-CM

## 2013-06-30 DIAGNOSIS — E119 Type 2 diabetes mellitus without complications: Secondary | ICD-10-CM

## 2013-06-30 DIAGNOSIS — F419 Anxiety disorder, unspecified: Secondary | ICD-10-CM

## 2013-06-30 DIAGNOSIS — O26899 Other specified pregnancy related conditions, unspecified trimester: Secondary | ICD-10-CM

## 2013-06-30 DIAGNOSIS — O24919 Unspecified diabetes mellitus in pregnancy, unspecified trimester: Secondary | ICD-10-CM | POA: Insufficient documentation

## 2013-06-30 MED ORDER — LORAZEPAM 0.5 MG PO TABS: 0.5000 mg | ORAL_TABLET | Freq: Three times a day (TID) | ORAL | Status: DC

## 2013-06-30 NOTE — Progress Notes (Signed)
Since last seen here fastings had been 80-98 until 2 wks ago 115-123. She had self adjusted her glyburide dose down to 2.5 at night, but then for the past week increased back to 5mg  at hs and fastings 85-92 (except 1 outlier 135 when she took glyburide dose at 6pm and later had dinner and snack). Reports postprandials 100-113. Still declines 24 hr urine and labs at this visit. Plan check glucose monitor next.  Plans BTL with tertiary C/S.  No concerns. Fetus active.

## 2013-06-30 NOTE — Addendum Note (Signed)
Addended by: Danae OrleansPOE, Ledonna Dormer C on: 06/30/2013 08:55 AM   Modules accepted: Orders

## 2013-06-30 NOTE — Patient Instructions (Signed)

## 2013-07-01 IMAGING — US US FETAL BPP W/O NONSTRESS
1 series · 11 of 11 positions shown · non-contrast
Comparison: none

OBSTETRICS REPORT
                      (Signed Final 07/15/2012 [DATE])

Service(s) Provided
Indications
 Diabetes - Pregestational (? began glyburide early
 in pregnancy)
 Poor obstetric history: Previous gestational
 diabetes
 History of cesarean delivery, currently pregnant      654.20,
Fetal Evaluation
 Num Of Fetuses:    1
 Fetal Heart Rate:  125                          bpm
 Cardiac Activity:  Observed
 Presentation:      Cephalic
 Placenta:          Anterior, above cervical os
 P. Cord            Previously Visualized
 Insertion:
 Amniotic Fluid
 AFI FV:      Polyhydramnios
 AFI Sum:     30.01   cm     > 97  %Tile     Larg Pckt:    8.47  cm
 RUQ:   8.47    cm   RLQ:    8.2    cm    LUQ:   6.72    cm   LLQ:    6.62   cm
Biophysical Evaluation
 Amniotic F.V:   Polyhydramnios             F. Tone:        Observed
 F. Movement:    Observed                   Score:          [DATE]
 F. Breathing:   Observed
Gestational Age
 Best:          36w 0d     Det. By:  Early Ultrasound         EDD:   08/12/12
Impression
INDICATION: 31 yr old [id number] at 91w9d with gestational
 diabetes A2 for BPP.

[Series 1: us fetal bpp w/o nonstress · 0.28mm/px · 11 of 11 slices shown]
[im 1/11]
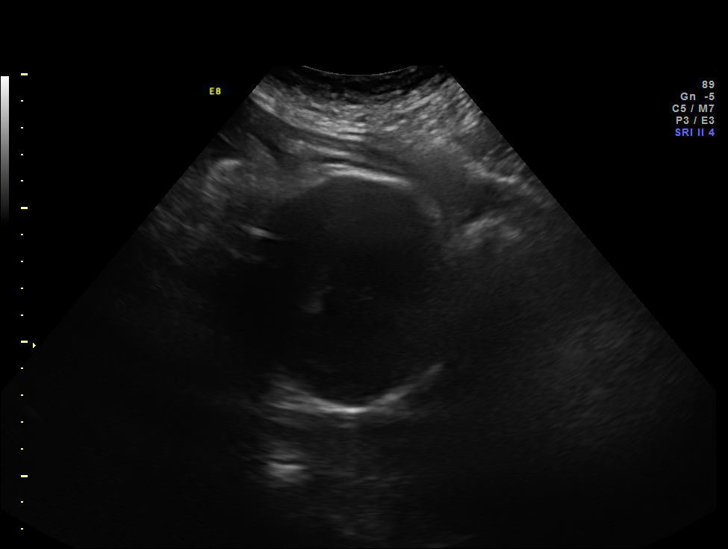
[im 2/11]
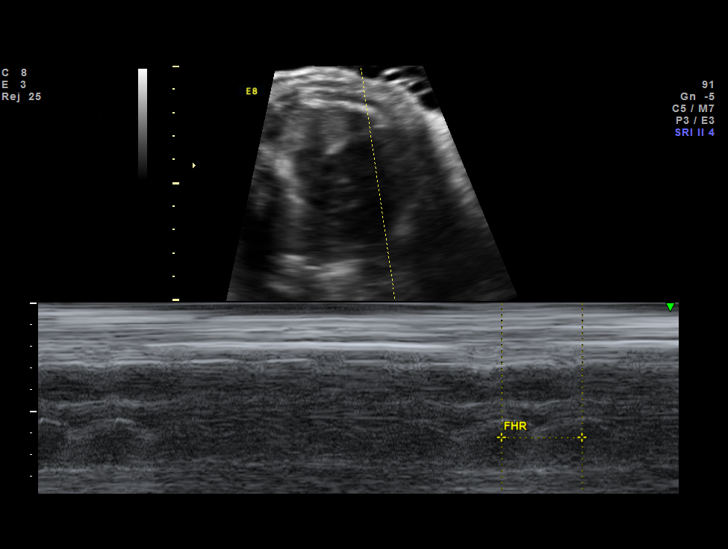
[im 3/11]
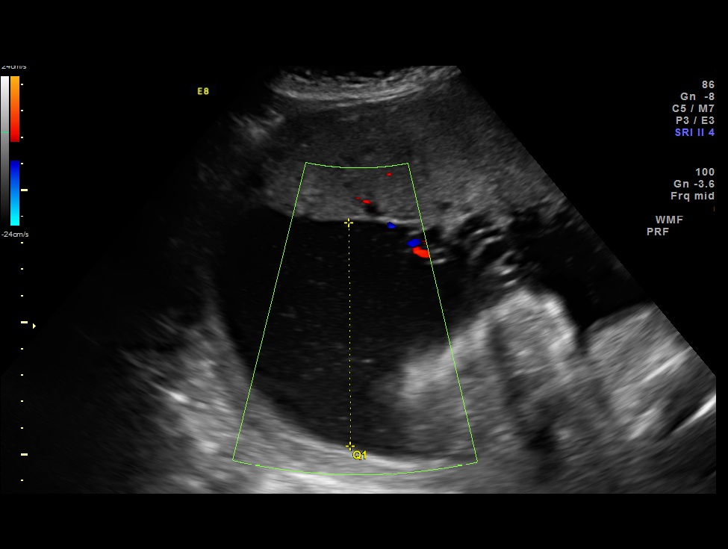
[im 4/11]
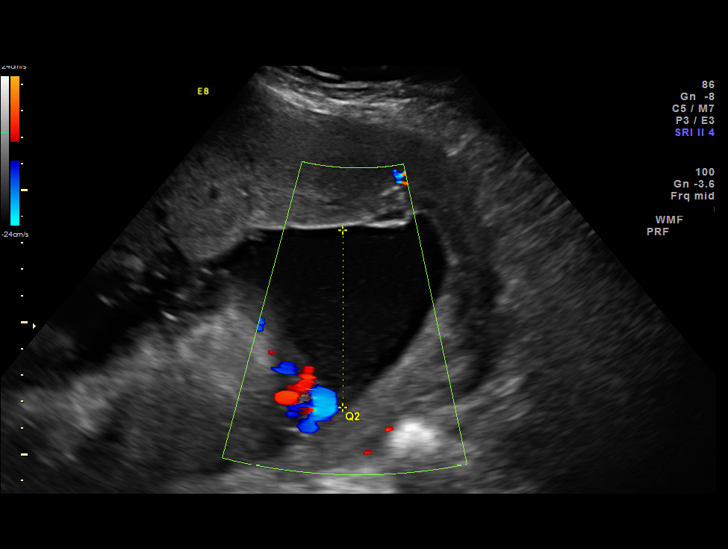
[im 5/11]
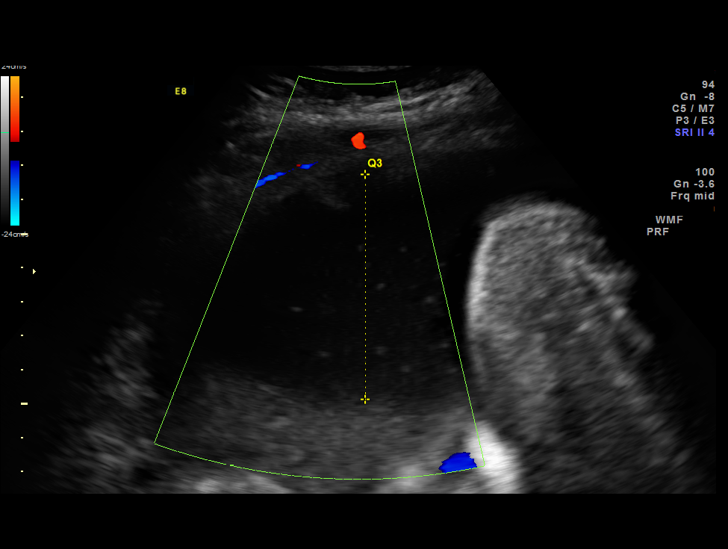
[im 6/11]
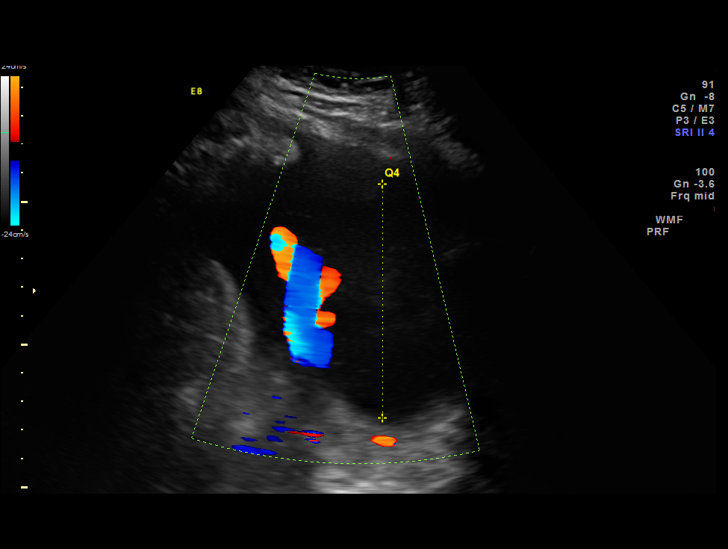
[im 7/11]
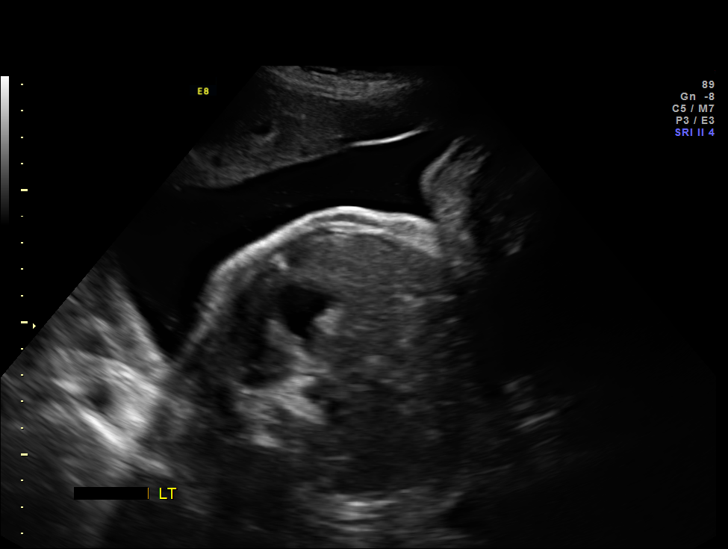
[im 8/11]
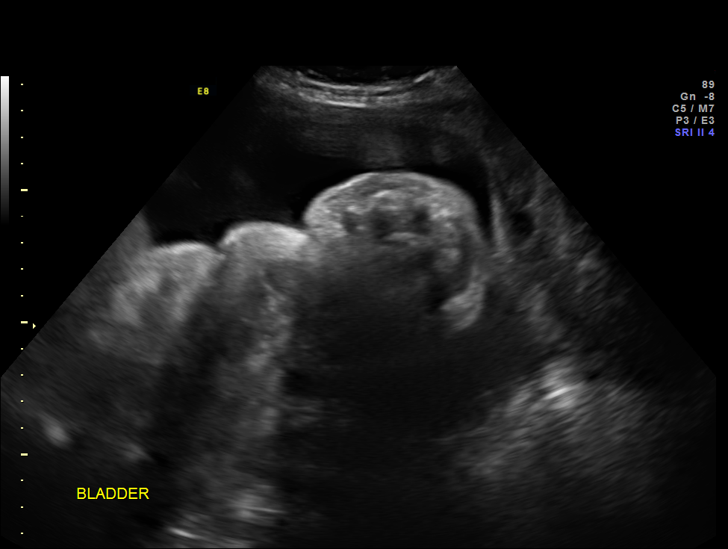
[im 9/11]
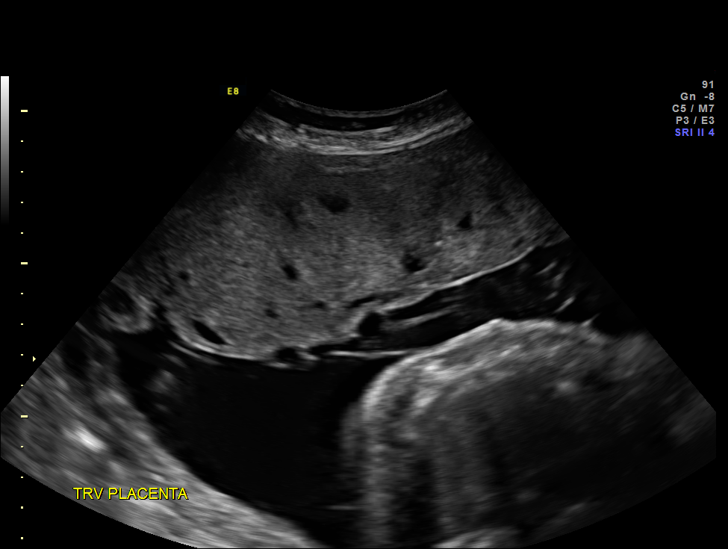
[im 10/11]
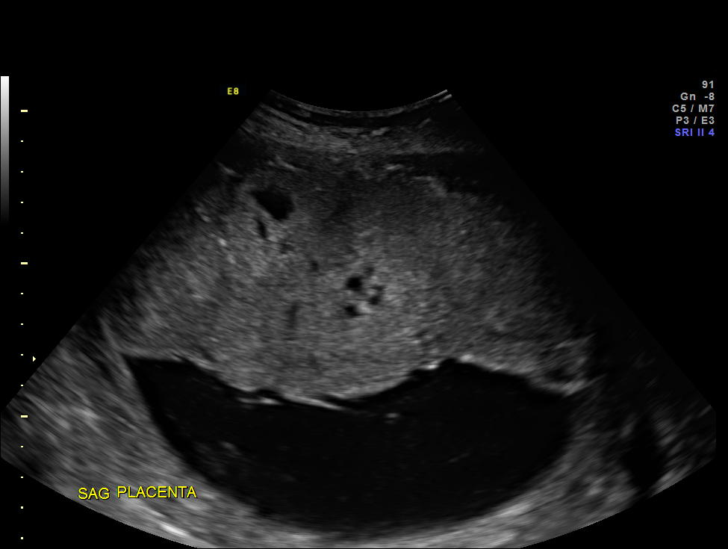
[im 11/11]
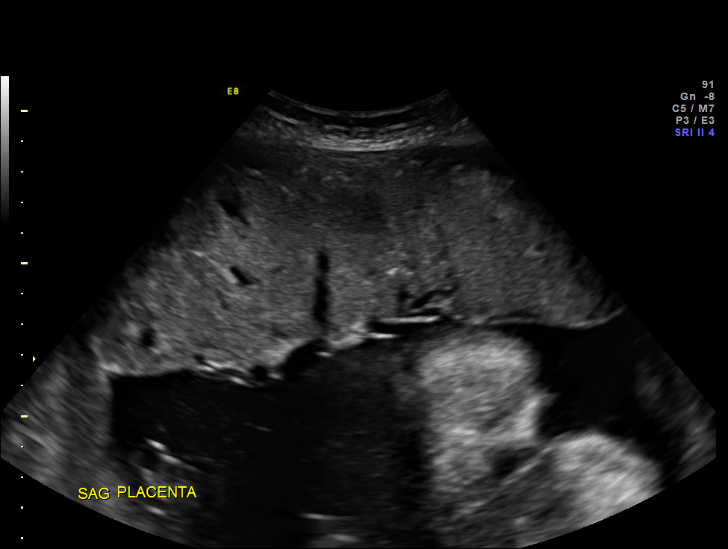

[11 of 11 positions shown; findings below may reference images not displayed]

FINDINGS: 1. Single intrauterine pregnancy.
 2. Anterior placenta without evidence of previa.
 3. Polyhydramnios
 4. The limited anatomy survey is normal.
 5. Normal biophysical profile of [DATE].
Recommendations

 Gestational diabetes:
 - adjust glyburide as clinically indicated;
 - recommend fetal growth every 4 weeks; due next week
 (scheduled 07/22/12)
 - recommend continue antenatal testing (weekly BPP)
 - recommend delivery by estimated due date but not prior to
 39 weeks in the absence of other complications
 - recommend strict glucose control with fastings 70-92 and 2
 hour post prandial <120

 questions or concerns.

## 2013-07-01 NOTE — Progress Notes (Signed)
   THERAPIST PROGRESS NOTE  Session Time: 4:00-4:50 pm   Participation Level: Active  Behavioral Response: CasualAlertAnxious  Type of Therapy: Individual Therapy  Treatment Goals addressed: Fear of Needles and Panic Disorder  Interventions: CBT, Biofeedback and Supportive  Summary: Kathryn DowningJennifer Peterson is a 33 y.o. female who presents with severe anxious mood pertaining to the date she was given today of her upcoming [redacted] week pregnant blood draw. Pt said is scheduled for June 4th and she expressed panic and anxiety about not feeling ready. Pt participated in making a plan to prepare the clinic and herself for the blood work. Pt identified calming strategies and information she would need to use to better plan for this blood draw. Pt said she continues to use her biofeedback and plans to use that during her procedure.   Suicidal/Homicidal: No   Therapist Response: Assessed overall level of panic and anxiety associated with blood draw, created an action plan, spoke with the office manager of [facility] to prepare the lab for Pts anxiety plan, processed feelings associated with procedure.   Plan: Return again in 1  weeks.  Diagnosis: Axis I: Panic Disorder       Carman ChingGLEHORN,Yordan Martindale E, LCSW 07/01/2013

## 2013-07-02 NOTE — Addendum Note (Signed)
Addended by: Granville LewisLARK, Daron Stutz L on: 07/02/2013 03:14 PM   Modules accepted: Orders

## 2013-07-08 ENCOUNTER — Ambulatory Visit (HOSPITAL_COMMUNITY): Payer: Self-pay | Admitting: Psychiatry

## 2013-07-08 IMAGING — US US FETAL BPP W/O NONSTRESS
1 series · 12 of 28 positions shown · non-contrast
Comparison: none

[Series 1: us fetal bpp w/o nonstress · 0.23mm/px · 35 acquisitions, 12 frames shown]
[im 2/35]
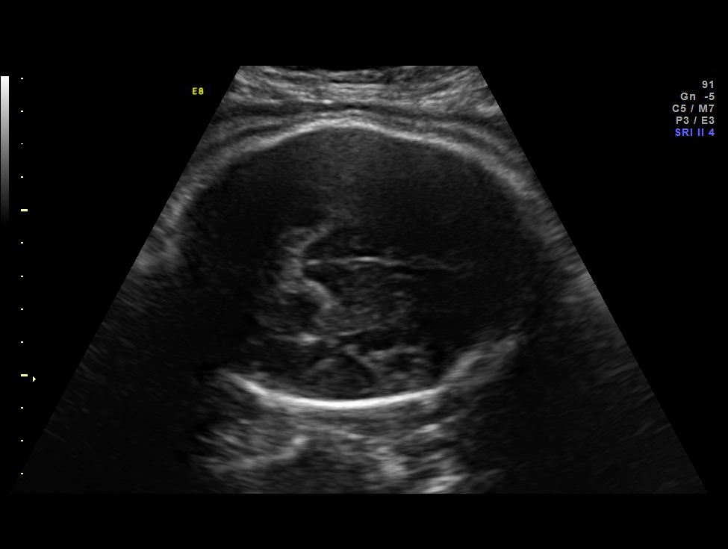
[im 4/35]
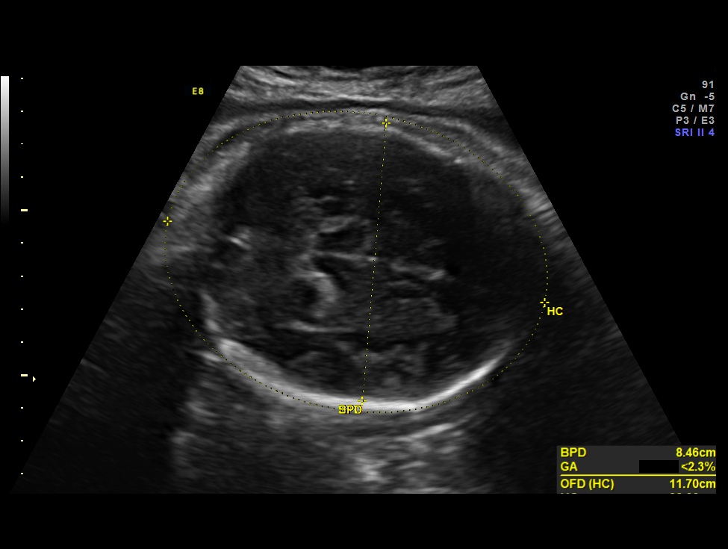
[im 7/35]
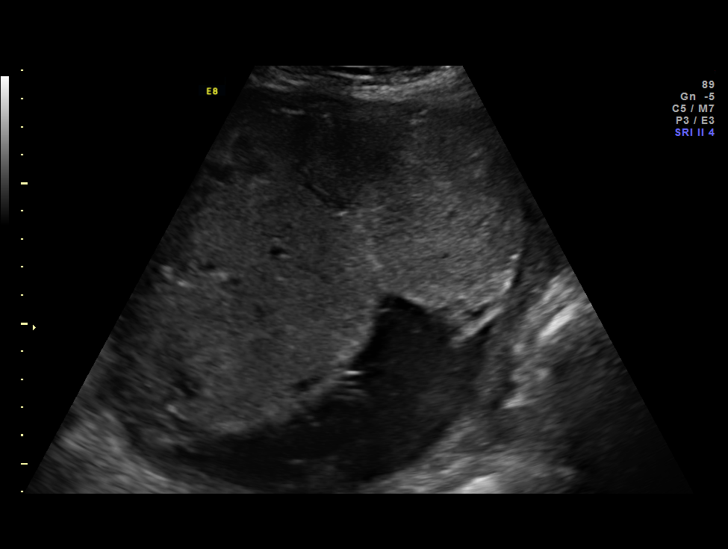
[im 11/35]
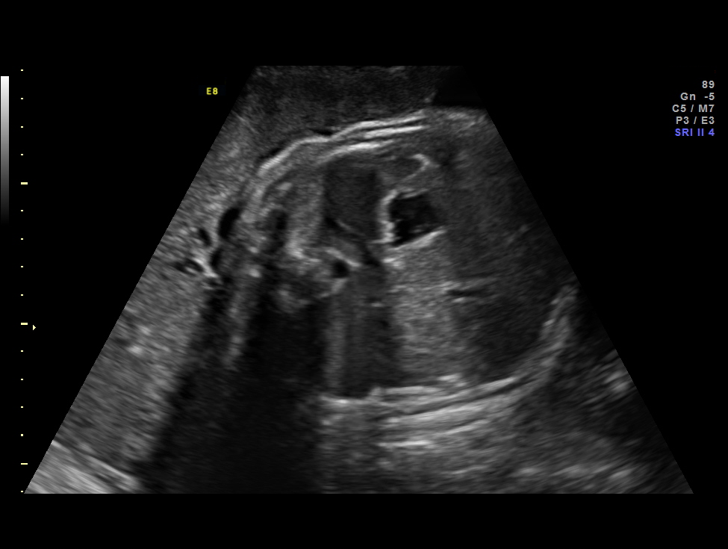
[im 13/35]
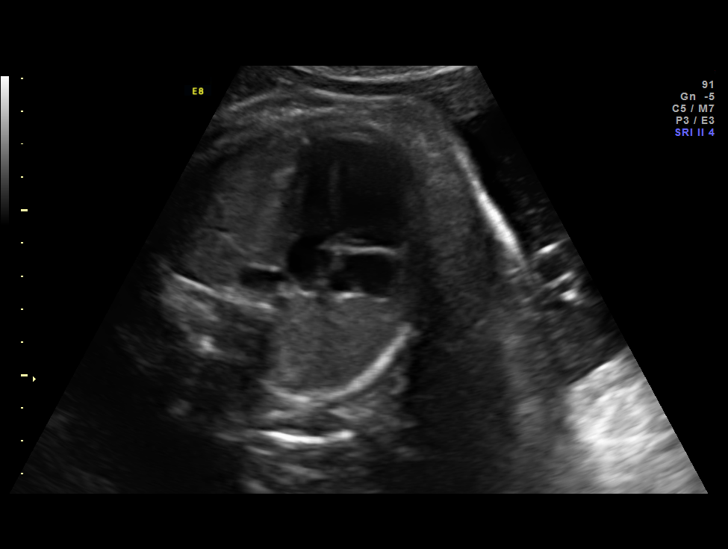
[im 16/35]
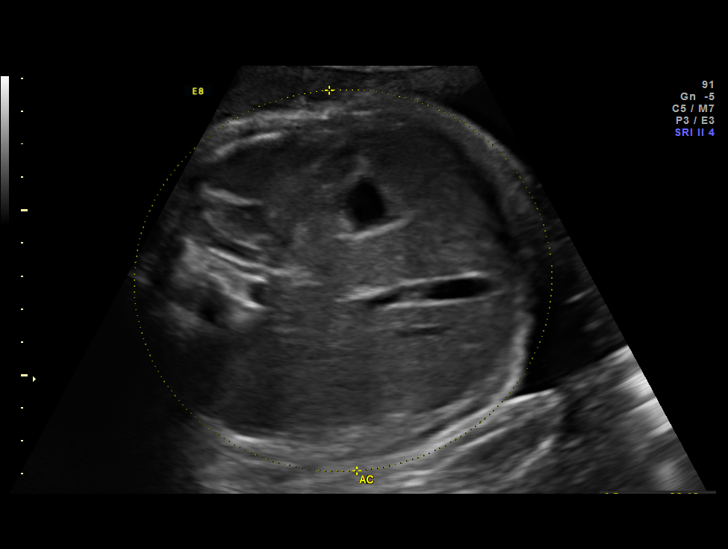
[im 19/35]
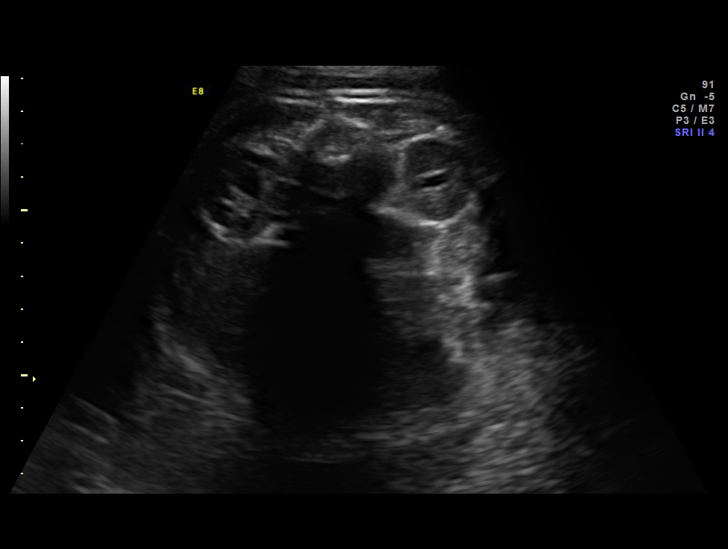
[im 22/35]
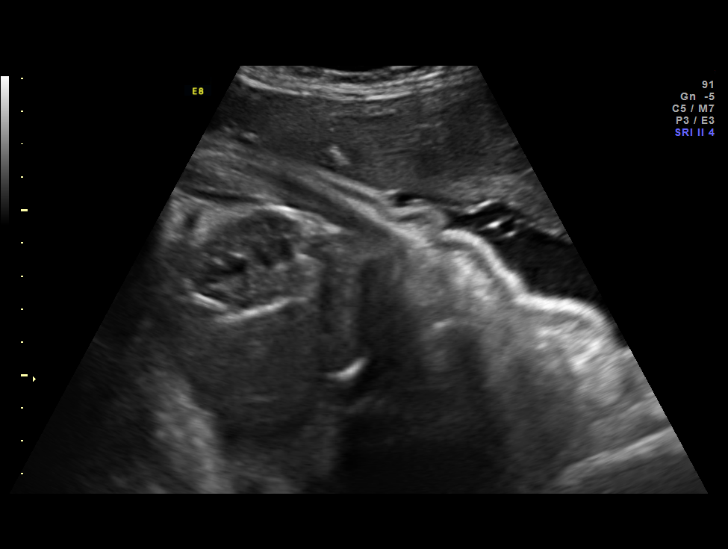
[im 24/35]
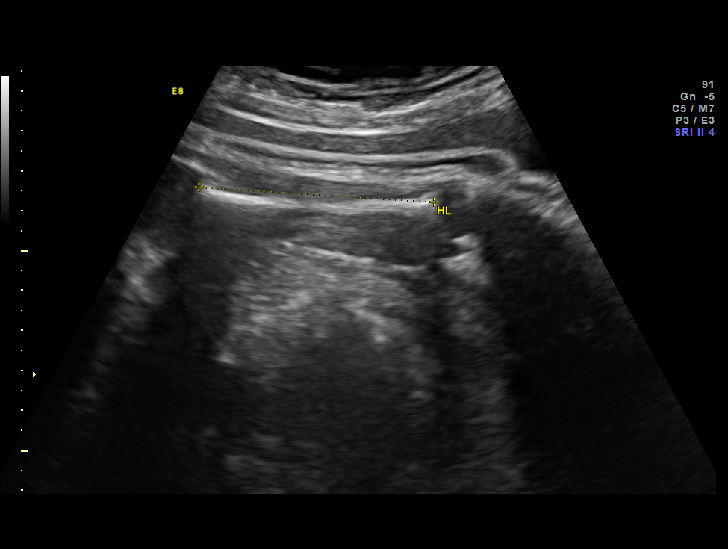
[im 28/35]
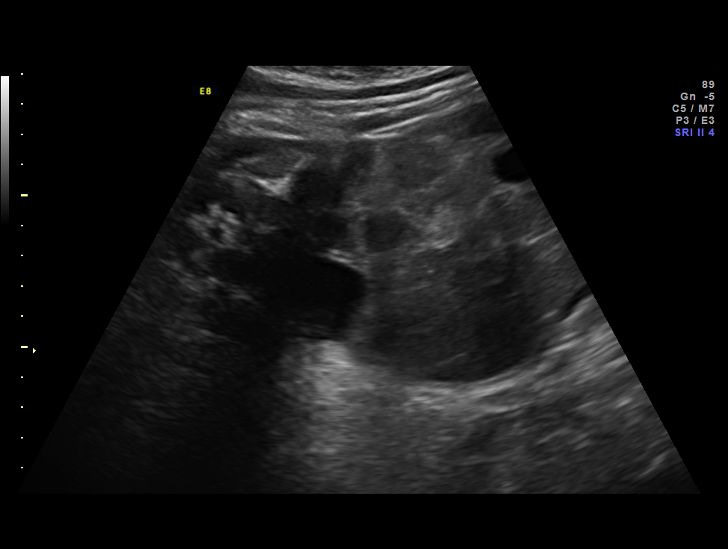
[im 31/35]
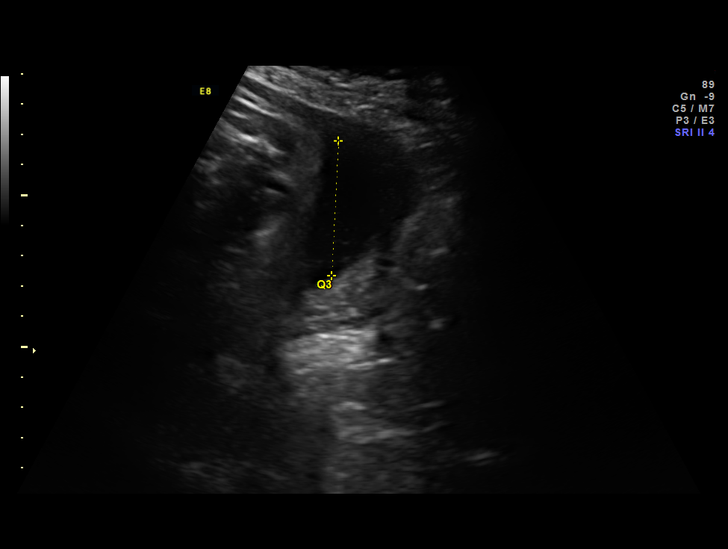
[im 33/35]
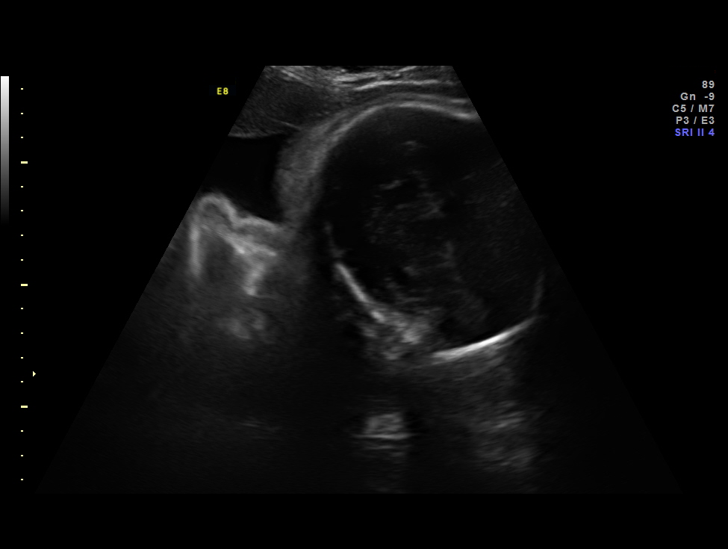

[12 of 28 positions shown; findings below may reference images not displayed]

OBSTETRICS REPORT
                      (Signed Final 07/22/2012 [DATE])

                                                         Faculty Physician
Service(s) Provided

 US OB FOLLOW UP                                       76816.1
Indications

 Diabetes - Pregestational (? began glyburide early
 in pregnancy)
 Poor obstetric history: Previous gestational
 diabetes
 History of cesarean delivery, currently pregnant      654.20,
Fetal Evaluation

 Num Of Fetuses:    1
 Fetal Heart Rate:  145                          bpm
 Cardiac Activity:  Observed
 Presentation:      Cephalic
 Placenta:          Anterior, above cervical os
 P. Cord            Previously Visualized
 Insertion:

 Amniotic Fluid
 AFI FV:      Polyhydramnios
 AFI Sum:     26.48   cm       96  %Tile     Larg Pckt:    9.03  cm
 RUQ:   4.7     cm   RLQ:    9.03   cm    LUQ:   8.3     cm   LLQ:    4.45   cm
Biophysical Evaluation

 Amniotic F.V:   Within normal limits       F. Tone:        Observed
 F. Movement:    Observed                   Score:          [DATE]
 F. Breathing:   Observed
Biometry

 BPD:     85.6  mm     G. Age:  34w 4d                CI:         71.6   70 - 86
 OFD:    119.6  mm                                    FL/HC:      20.9   20.8 -

 HC:     329.9  mm     G. Age:  37w 4d       37  %    HC/AC:      0.85   0.92 -

 AC:     387.5  mm     G. Age:  N/A        > 97  %    FL/BPD:     80.4   71 - 87
 FL:      68.8  mm     G. Age:  35w 2d       13  %    FL/AC:      17.8   20 - 24
 HUM:     61.1  mm     G. Age:  35w 3d       40  %
 Est. FW:    4242  gm      8 lb 7 oz   > 90  %
Gestational Age

 U/S Today:     35w 6d                                        EDD:   08/20/12
 Best:          37w 0d     Det. By:  Early Ultrasound         EDD:   08/12/12
Anatomy

 Cranium:          Previously seen        Aortic Arch:      Previously seen
 Fetal Cavum:      Previously seen        Ductal Arch:      Previously seen
 Ventricles:       Appears normal         Diaphragm:        Appears normal
 Choroid Plexus:   Previously seen        Stomach:          Appears normal, left
                                                            sided
 Cerebellum:       Previously seen        Abdomen:          Previously seen
 Posterior Fossa:  Previously seen        Abdominal Wall:   Previously seen
 Nuchal Fold:      Previously seen        Cord Vessels:     Previously seen
 Face:             Orbits and profile     Kidneys:          Appear normal
                   previously seen
 Lips:             Previously seen        Bladder:          Appears normal
 Heart:            Appears normal         Spine:            Previously seen
                   (4CH, axis, and
                   situs)
 RVOT:             Previously seen        Lower             Previously seen
                                          Extremities:
 LVOT:             Previously seen        Upper             Previously seen
                                          Extremities:

 Other:  Male gender. Heels and 5th digit previously seen.
Cervix Uterus Adnexa

 Cervix:       Not visualized (advanced GA >24wks)
Impression

 Single IUP at 37 0/7 weeks
 A2 GDM on Glyburide
 The estimated fetal weight today is > 90th %tile (4242 g)
 Mild polyhydramnios (AFI 26.5 cm)
 Active fetus with BPP of [DATE]
Recommendations

 Continue weekly BPPs.
 Recommend delivery by EDD but not prior to 39 weeks in the
 absence of complications.

 questions or concerns.

## 2013-07-14 ENCOUNTER — Ambulatory Visit (HOSPITAL_COMMUNITY)
Admission: RE | Admit: 2013-07-14 | Discharge: 2013-07-14 | Disposition: A | Discharge status: Home / Self Care | Payer: Medicaid Other | Source: Ambulatory Visit | Attending: Obstetrics and Gynecology | Admitting: Obstetrics and Gynecology

## 2013-07-14 ENCOUNTER — Encounter (HOSPITAL_COMMUNITY): Payer: Self-pay

## 2013-07-14 ENCOUNTER — Other Ambulatory Visit: Payer: Self-pay | Admitting: Obstetrics & Gynecology

## 2013-07-14 DIAGNOSIS — O337XX0 Maternal care for disproportion due to other fetal deformities, not applicable or unspecified: Secondary | ICD-10-CM

## 2013-07-14 DIAGNOSIS — O3421 Maternal care for scar from previous cesarean delivery: Secondary | ICD-10-CM

## 2013-07-14 DIAGNOSIS — O24919 Unspecified diabetes mellitus in pregnancy, unspecified trimester: Secondary | ICD-10-CM | POA: Insufficient documentation

## 2013-07-14 DIAGNOSIS — Z3689 Encounter for other specified antenatal screening: Secondary | ICD-10-CM | POA: Insufficient documentation

## 2013-07-14 DIAGNOSIS — O34219 Maternal care for unspecified type scar from previous cesarean delivery: Secondary | ICD-10-CM | POA: Insufficient documentation

## 2013-07-15 IMAGING — US US FETAL BPP W/O NONSTRESS
1 series · 6 of 6 positions shown · non-contrast
Comparison: none

[Series 1: us fetal bpp w/o nonstress · 6 of 6 slices shown]
[im 1/6]
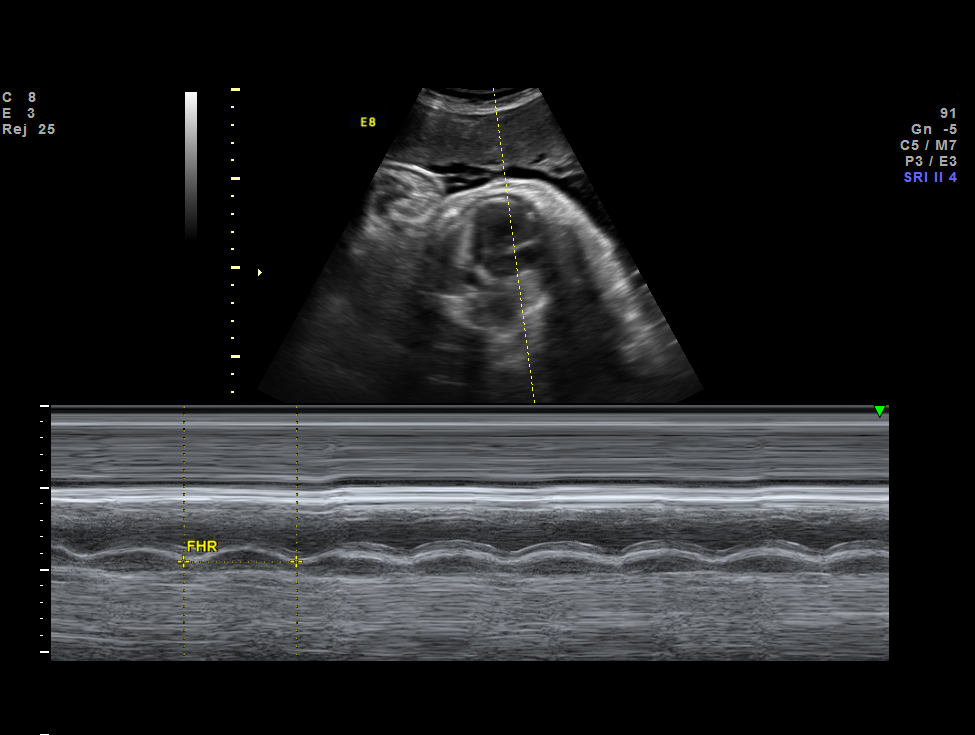
[im 2/6]
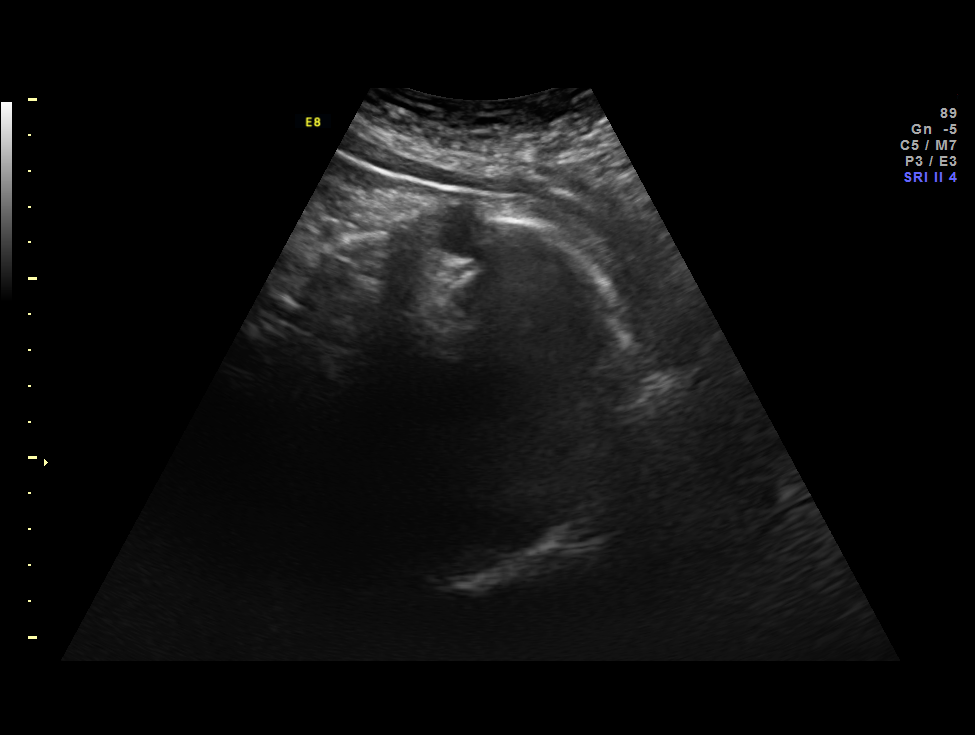
[im 3/6]
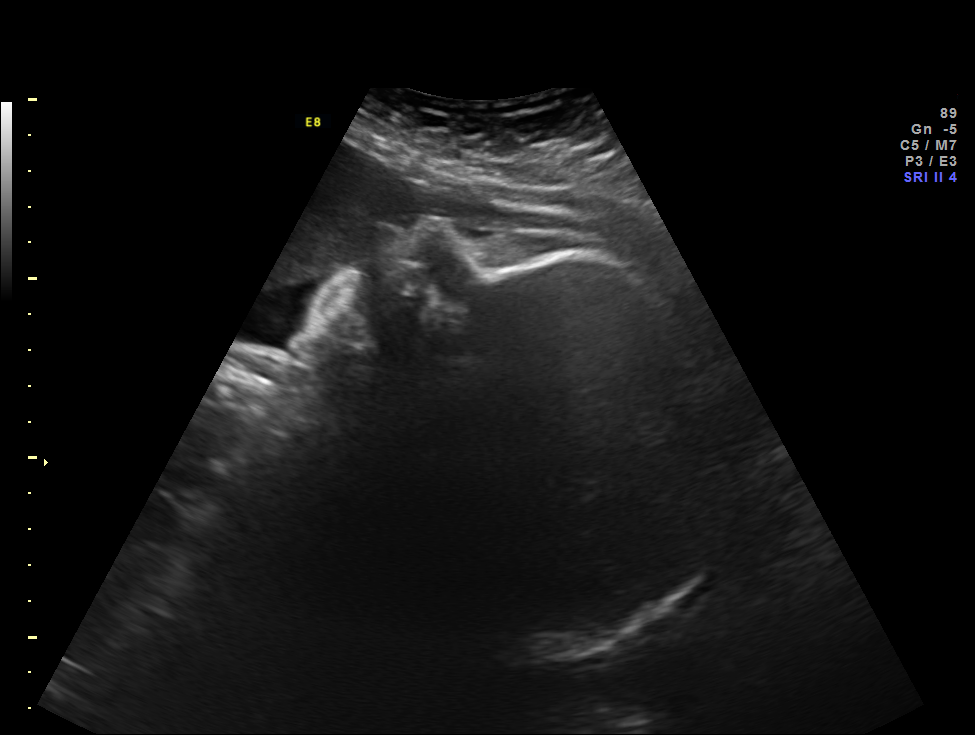
[im 4/6]
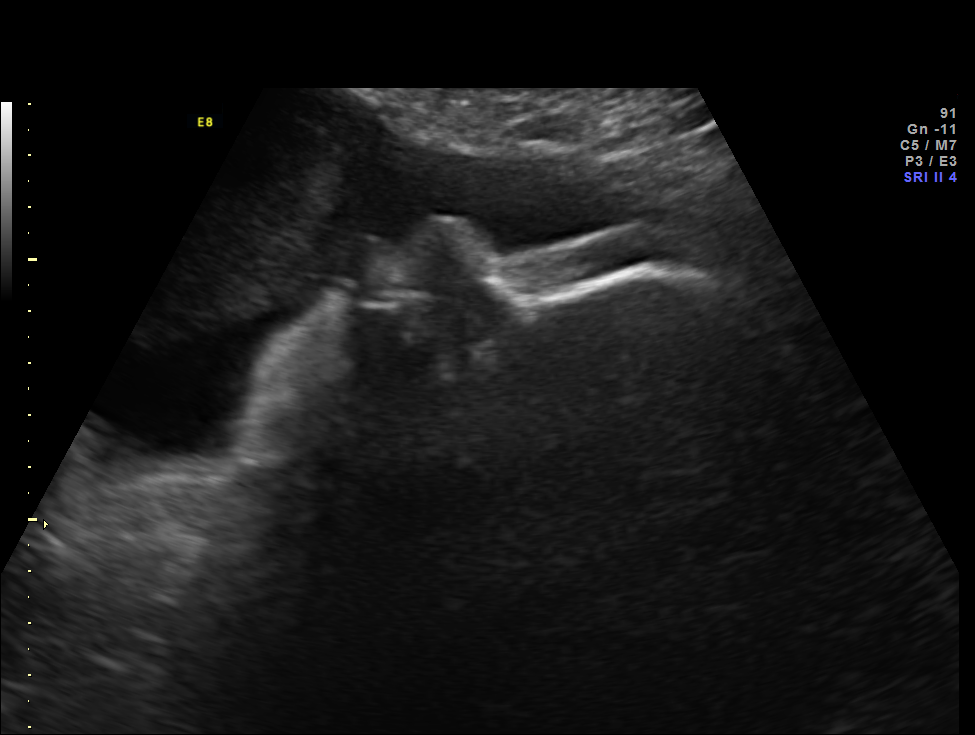
[im 5/6]
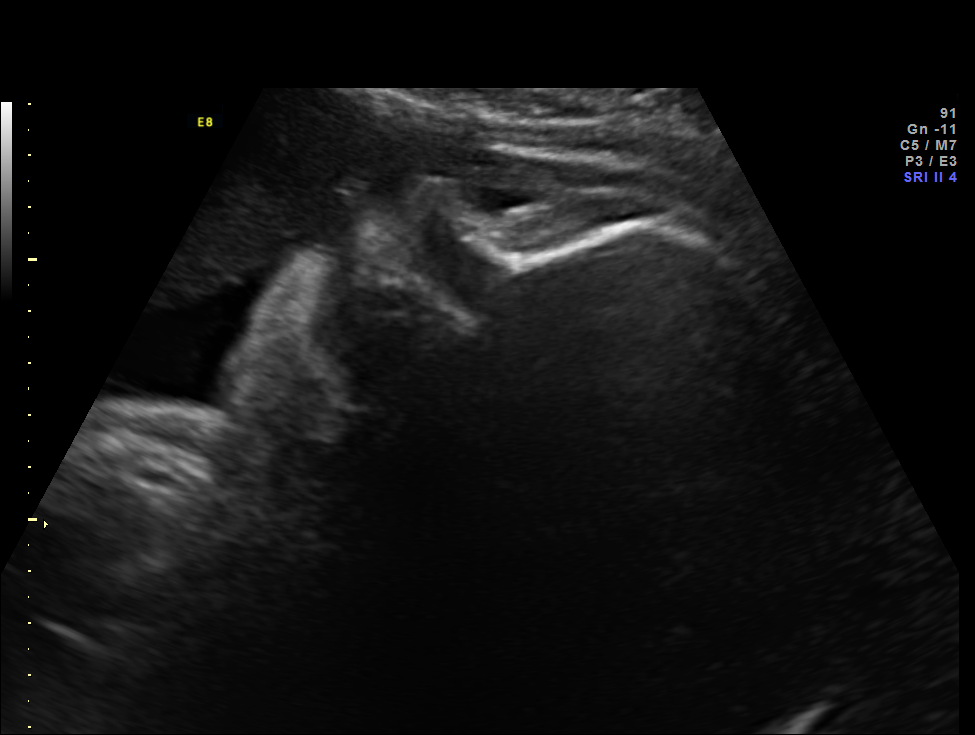
[im 6/6]
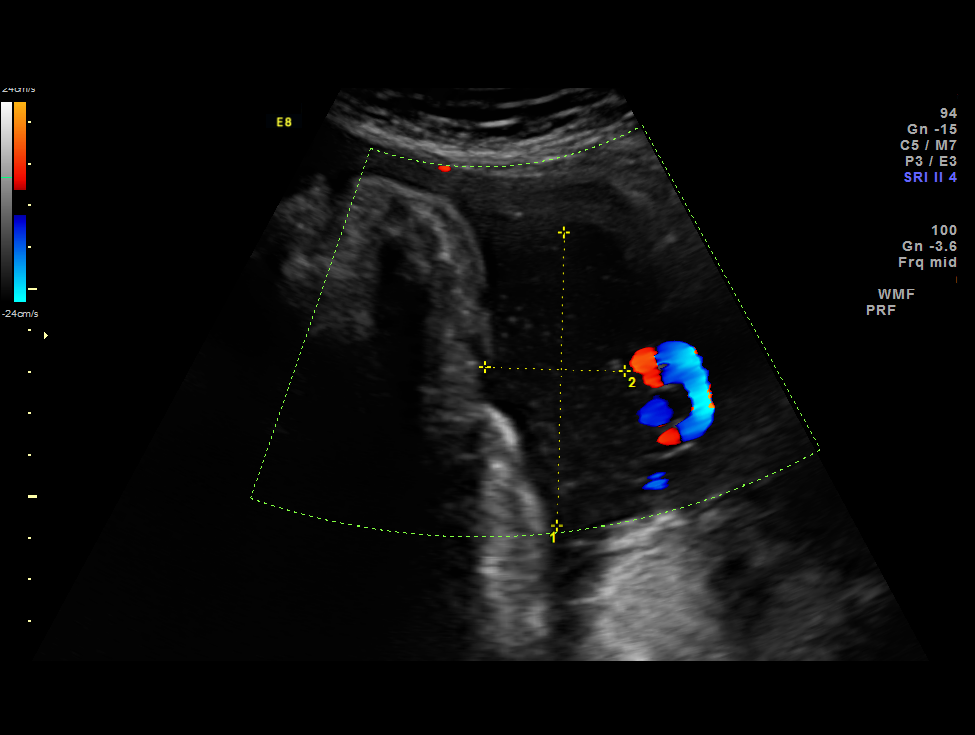

[6 of 6 positions shown; findings below may reference images not displayed]

OBSTETRICS REPORT
                      (Signed Final 07/29/2012 [DATE])

Service(s) Provided

Indications

 Diabetes - Pregestational (? began glyburide early
 in pregnancy)
Fetal Evaluation

 Num Of Fetuses:    1
 Fetal Heart Rate:  119                          bpm
 Cardiac Activity:  Observed
 Presentation:      Cephalic

 Comment:    [DATE] BPP in 3 mins.

 Amniotic Fluid
 AFI FV:      Subjectively within normal limits
                                             Larg Pckt:     7.0  cm
Biophysical Evaluation

 Amniotic F.V:   Within normal limits       F. Tone:        Observed
 F. Movement:    Observed                   Score:          [DATE]
 F. Breathing:   Observed
Gestational Age

 Best:          38w 0d     Det. By:  Early Ultrasound         EDD:   08/12/12
Impression

 IUP at  38+0 weeks
 Normal amniotic fluid volume
 BPP [DATE]
Recommendations

 Delivery planned for 39 weeks

 questions or concerns.

## 2013-07-17 ENCOUNTER — Other Ambulatory Visit (INDEPENDENT_AMBULATORY_CARE_PROVIDER_SITE_OTHER): Payer: Medicaid Other

## 2013-07-17 ENCOUNTER — Other Ambulatory Visit: Payer: Self-pay | Admitting: Obstetrics and Gynecology

## 2013-07-17 DIAGNOSIS — O26899 Other specified pregnancy related conditions, unspecified trimester: Secondary | ICD-10-CM

## 2013-07-17 DIAGNOSIS — O360990 Maternal care for other rhesus isoimmunization, unspecified trimester, not applicable or unspecified: Secondary | ICD-10-CM

## 2013-07-17 DIAGNOSIS — Z6791 Unspecified blood type, Rh negative: Principal | ICD-10-CM

## 2013-07-17 MED ORDER — RHO D IMMUNE GLOBULIN 1500 UNIT/2ML IJ SOSY
300.0000 ug | PREFILLED_SYRINGE | Freq: Once | INTRAMUSCULAR | Status: AC
  Administered 2013-07-17: 300 ug via INTRAMUSCULAR

## 2013-07-18 ENCOUNTER — Encounter: Payer: Self-pay | Admitting: Advanced Practice Midwife

## 2013-07-18 ENCOUNTER — Ambulatory Visit (INDEPENDENT_AMBULATORY_CARE_PROVIDER_SITE_OTHER): Payer: Medicaid Other | Admitting: Advanced Practice Midwife

## 2013-07-18 ENCOUNTER — Encounter: Payer: Self-pay | Admitting: *Deleted

## 2013-07-18 VITALS — BP 110/66 | HR 106 | Wt 193.0 lb

## 2013-07-18 DIAGNOSIS — Z6791 Unspecified blood type, Rh negative: Secondary | ICD-10-CM | POA: Insufficient documentation

## 2013-07-18 DIAGNOSIS — O26899 Other specified pregnancy related conditions, unspecified trimester: Secondary | ICD-10-CM

## 2013-07-18 DIAGNOSIS — E119 Type 2 diabetes mellitus without complications: Secondary | ICD-10-CM

## 2013-07-18 DIAGNOSIS — Z3480 Encounter for supervision of other normal pregnancy, unspecified trimester: Secondary | ICD-10-CM

## 2013-07-18 DIAGNOSIS — O24919 Unspecified diabetes mellitus in pregnancy, unspecified trimester: Secondary | ICD-10-CM

## 2013-07-18 LAB — HEMOGLOBIN A1C
Hgb A1c MFr Bld: 5.6 % (ref <5.7)
MEAN PLASMA GLUCOSE: 114 mg/dL (ref <117)

## 2013-07-18 LAB — OBSTETRIC PANEL
ANTIBODY SCREEN: NEGATIVE
Basophils Absolute: 0 10*3/uL (ref 0.0–0.1)
Basophils Relative: 0 % (ref 0–1)
Eosinophils Absolute: 0.1 10*3/uL (ref 0.0–0.7)
Eosinophils Relative: 1 % (ref 0–5)
HEMATOCRIT: 29.5 % — AB (ref 36.0–46.0)
HEP B S AG: NEGATIVE
Hemoglobin: 9.9 g/dL — ABNORMAL LOW (ref 12.0–15.0)
LYMPHS ABS: 2 10*3/uL (ref 0.7–4.0)
LYMPHS PCT: 19 % (ref 12–46)
MCH: 30.7 pg (ref 26.0–34.0)
MCHC: 33.6 g/dL (ref 30.0–36.0)
MCV: 91.3 fL (ref 78.0–100.0)
MONO ABS: 1 10*3/uL (ref 0.1–1.0)
MONOS PCT: 9 % (ref 3–12)
NEUTROS ABS: 7.6 10*3/uL (ref 1.7–7.7)
NEUTROS PCT: 71 % (ref 43–77)
Platelets: 284 10*3/uL (ref 150–400)
RBC: 3.23 MIL/uL — AB (ref 3.87–5.11)
RDW: 14.9 % (ref 11.5–15.5)
RH TYPE: NEGATIVE
RUBELLA: 1.92 {index} — AB (ref <0.90)
WBC: 10.7 10*3/uL — ABNORMAL HIGH (ref 4.0–10.5)

## 2013-07-18 LAB — HIV ANTIBODY (ROUTINE TESTING W REFLEX): HIV 1&2 Ab, 4th Generation: NONREACTIVE

## 2013-07-18 NOTE — Progress Notes (Signed)
Had blood drawn yesterday. Did not bring sugar log. States has not eaten well past two weeks due to stress/family issues. States Fastings 90-120s, Two hours around 120-150.  States is now doing 5mg  glyburide at night. Encouraged to bring log next visit. Will try to add Hemoglobin A1C to labs from yesterday.

## 2013-07-18 NOTE — Progress Notes (Signed)
Ketones trace

## 2013-07-18 NOTE — Patient Instructions (Signed)
Third Trimester of Pregnancy  The third trimester is from week 29 through week 42, months 7 through 9. The third trimester is a time when the fetus is growing rapidly. At the end of the ninth month, the fetus is about 20 inches in length and weighs 6 10 pounds.   BODY CHANGES  Your body goes through many changes during pregnancy. The changes vary from woman to woman.    Your weight will continue to increase. You can expect to gain 25 35 pounds (11 16 kg) by the end of the pregnancy.   You may begin to get stretch marks on your hips, abdomen, and breasts.   You may urinate more often because the fetus is moving lower into your pelvis and pressing on your bladder.   You may develop or continue to have heartburn as a result of your pregnancy.   You may develop constipation because certain hormones are causing the muscles that push waste through your intestines to slow down.   You may develop hemorrhoids or swollen, bulging veins (varicose veins).   You may have pelvic pain because of the weight gain and pregnancy hormones relaxing your joints between the bones in your pelvis. Back aches may result from over exertion of the muscles supporting your posture.   Your breasts will continue to grow and be tender. A yellow discharge may leak from your breasts called colostrum.   Your belly button may stick out.   You may feel short of breath because of your expanding uterus.   You may notice the fetus "dropping," or moving lower in your abdomen.   You may have a bloody mucus discharge. This usually occurs a few days to a week before labor begins.   Your cervix becomes thin and soft (effaced) near your due date.  WHAT TO EXPECT AT YOUR PRENATAL EXAMS   You will have prenatal exams every 2 weeks until week 36. Then, you will have weekly prenatal exams. During a routine prenatal visit:   You will be weighed to make sure you and the fetus are growing normally.   Your blood pressure is taken.   Your abdomen will be  measured to track your baby's growth.   The fetal heartbeat will be listened to.   Any test results from the previous visit will be discussed.   You may have a cervical check near your due date to see if you have effaced.  At around 36 weeks, your caregiver will check your cervix. At the same time, your caregiver will also perform a test on the secretions of the vaginal tissue. This test is to determine if a type of bacteria, Group B streptococcus, is present. Your caregiver will explain this further.  Your caregiver may ask you:   What your birth plan is.   How you are feeling.   If you are feeling the baby move.   If you have had any abnormal symptoms, such as leaking fluid, bleeding, severe headaches, or abdominal cramping.   If you have any questions.  Other tests or screenings that may be performed during your third trimester include:   Blood tests that check for low iron levels (anemia).   Fetal testing to check the health, activity level, and growth of the fetus. Testing is done if you have certain medical conditions or if there are problems during the pregnancy.  FALSE LABOR  You may feel small, irregular contractions that eventually go away. These are called Braxton Hicks contractions, or   false labor. Contractions may last for hours, days, or even weeks before true labor sets in. If contractions come at regular intervals, intensify, or become painful, it is best to be seen by your caregiver.   SIGNS OF LABOR    Menstrual-like cramps.   Contractions that are 5 minutes apart or less.   Contractions that start on the top of the uterus and spread down to the lower abdomen and back.   A sense of increased pelvic pressure or back pain.   A watery or bloody mucus discharge that comes from the vagina.  If you have any of these signs before the 37th week of pregnancy, call your caregiver right away. You need to go to the hospital to get checked immediately.  HOME CARE INSTRUCTIONS    Avoid all  smoking, herbs, alcohol, and unprescribed drugs. These chemicals affect the formation and growth of the baby.   Follow your caregiver's instructions regarding medicine use. There are medicines that are either safe or unsafe to take during pregnancy.   Exercise only as directed by your caregiver. Experiencing uterine cramps is a good sign to stop exercising.   Continue to eat regular, healthy meals.   Wear a good support bra for breast tenderness.   Do not use hot tubs, steam rooms, or saunas.   Wear your seat belt at all times when driving.   Avoid raw meat, uncooked cheese, cat litter boxes, and soil used by cats. These carry germs that can cause birth defects in the baby.   Take your prenatal vitamins.   Try taking a stool softener (if your caregiver approves) if you develop constipation. Eat more high-fiber foods, such as fresh vegetables or fruit and whole grains. Drink plenty of fluids to keep your urine clear or pale yellow.   Take warm sitz baths to soothe any pain or discomfort caused by hemorrhoids. Use hemorrhoid cream if your caregiver approves.   If you develop varicose veins, wear support hose. Elevate your feet for 15 minutes, 3 4 times a day. Limit salt in your diet.   Avoid heavy lifting, wear low heal shoes, and practice good posture.   Rest a lot with your legs elevated if you have leg cramps or low back pain.   Visit your dentist if you have not gone during your pregnancy. Use a soft toothbrush to brush your teeth and be gentle when you floss.   A sexual relationship may be continued unless your caregiver directs you otherwise.   Do not travel far distances unless it is absolutely necessary and only with the approval of your caregiver.   Take prenatal classes to understand, practice, and ask questions about the labor and delivery.   Make a trial run to the hospital.   Pack your hospital bag.   Prepare the baby's nursery.   Continue to go to all your prenatal visits as directed  by your caregiver.  SEEK MEDICAL CARE IF:   You are unsure if you are in labor or if your water has broken.   You have dizziness.   You have mild pelvic cramps, pelvic pressure, or nagging pain in your abdominal area.   You have persistent nausea, vomiting, or diarrhea.   You have a bad smelling vaginal discharge.   You have pain with urination.  SEEK IMMEDIATE MEDICAL CARE IF:    You have a fever.   You are leaking fluid from your vagina.   You have spotting or bleeding from your vagina.     You have severe abdominal cramping or pain.   You have rapid weight loss or gain.   You have shortness of breath with chest pain.   You notice sudden or extreme swelling of your face, hands, ankles, feet, or legs.   You have not felt your baby move in over an hour.   You have severe headaches that do not go away with medicine.   You have vision changes.  Document Released: 01/24/2001 Document Revised: 10/02/2012 Document Reviewed: 04/02/2012  [employer] Patient Information 2014 [employer]

## 2013-07-20 LAB — CULTURE, URINE COMPREHENSIVE: Colony Count: 65000

## 2013-07-28 ENCOUNTER — Encounter: Payer: Self-pay | Admitting: Advanced Practice Midwife

## 2013-07-28 ENCOUNTER — Ambulatory Visit (INDEPENDENT_AMBULATORY_CARE_PROVIDER_SITE_OTHER): Payer: Medicaid Other | Admitting: Advanced Practice Midwife

## 2013-07-28 VITALS — BP 117/72 | HR 98 | Wt 194.0 lb

## 2013-07-28 DIAGNOSIS — K219 Gastro-esophageal reflux disease without esophagitis: Secondary | ICD-10-CM

## 2013-07-28 DIAGNOSIS — O99613 Diseases of the digestive system complicating pregnancy, third trimester: Secondary | ICD-10-CM

## 2013-07-28 DIAGNOSIS — R059 Cough, unspecified: Secondary | ICD-10-CM

## 2013-07-28 DIAGNOSIS — O26899 Other specified pregnancy related conditions, unspecified trimester: Principal | ICD-10-CM

## 2013-07-28 DIAGNOSIS — O3421 Maternal care for scar from previous cesarean delivery: Secondary | ICD-10-CM

## 2013-07-28 DIAGNOSIS — Z8632 Personal history of gestational diabetes: Secondary | ICD-10-CM

## 2013-07-28 DIAGNOSIS — Z6791 Unspecified blood type, Rh negative: Secondary | ICD-10-CM

## 2013-07-28 DIAGNOSIS — R05 Cough: Secondary | ICD-10-CM

## 2013-07-28 DIAGNOSIS — O09299 Supervision of pregnancy with other poor reproductive or obstetric history, unspecified trimester: Secondary | ICD-10-CM

## 2013-07-28 DIAGNOSIS — O09292 Supervision of pregnancy with other poor reproductive or obstetric history, second trimester: Secondary | ICD-10-CM

## 2013-07-28 DIAGNOSIS — O360990 Maternal care for other rhesus isoimmunization, unspecified trimester, not applicable or unspecified: Secondary | ICD-10-CM

## 2013-07-28 DIAGNOSIS — O9989 Other specified diseases and conditions complicating pregnancy, childbirth and the puerperium: Secondary | ICD-10-CM

## 2013-07-28 DIAGNOSIS — O34219 Maternal care for unspecified type scar from previous cesarean delivery: Secondary | ICD-10-CM

## 2013-07-28 MED ORDER — RANITIDINE HCL 150 MG PO TABS: 150.0000 mg | ORAL_TABLET | Freq: Two times a day (BID) | ORAL | Status: DC

## 2013-07-28 MED ORDER — BENZONATATE 100 MG PO CAPS: 200.0000 mg | ORAL_CAPSULE | Freq: Three times a day (TID) | ORAL | Status: DC | PRN

## 2013-07-28 NOTE — Progress Notes (Signed)
Fastings 80-98. 2 hour 110-120. 2 Outliers when missed dose/dietary reasons. Reviewed 28 week labs. Productive cough x 1 week. Tessalon Rx.

## 2013-07-28 NOTE — Patient Instructions (Addendum)
Zantac 150 mg twice a day.   Third Trimester of Pregnancy The third trimester is from week 29 through week 42, months 7 through 9. The third trimester is a time when the fetus is growing rapidly. At the end of the ninth month, the fetus is about 20 inches in length and weighs 6 10 pounds.  BODY CHANGES Your body goes through many changes during pregnancy. The changes vary from woman to woman.   Your weight will continue to increase. You can expect to gain 25 35 pounds (11 16 kg) by the end of the pregnancy.  You may begin to get stretch marks on your hips, abdomen, and breasts.  You may urinate more often because the fetus is moving lower into your pelvis and pressing on your bladder.  You may develop or continue to have heartburn as a result of your pregnancy.  You may develop constipation because certain hormones are causing the muscles that push waste through your intestines to slow down.  You may develop hemorrhoids or swollen, bulging veins (varicose veins).  You may have pelvic pain because of the weight gain and pregnancy hormones relaxing your joints between the bones in your pelvis. Back aches may result from over exertion of the muscles supporting your posture.  Your breasts will continue to grow and be tender. A yellow discharge may leak from your breasts called colostrum.  Your belly button may stick out.  You may feel short of breath because of your expanding uterus.  You may notice the fetus "dropping," or moving lower in your abdomen.  You may have a bloody mucus discharge. This usually occurs a few days to a week before labor begins.  Your cervix becomes thin and soft (effaced) near your due date. WHAT TO EXPECT AT YOUR PRENATAL EXAMS  You will have prenatal exams every 2 weeks until week 36. Then, you will have weekly prenatal exams. During a routine prenatal visit:  You will be weighed to make sure you and the fetus are growing normally.  Your blood pressure is  taken.  Your abdomen will be measured to track your baby's growth.  The fetal heartbeat will be listened to.  Any test results from the previous visit will be discussed.  You may have a cervical check near your due date to see if you have effaced. At around 36 weeks, your caregiver will check your cervix. At the same time, your caregiver will also perform a test on the secretions of the vaginal tissue. This test is to determine if a type of bacteria, Group B streptococcus, is present. Your caregiver will explain this further. Your caregiver may ask you:  What your birth plan is.  How you are feeling.  If you are feeling the baby move.  If you have had any abnormal symptoms, such as leaking fluid, bleeding, severe headaches, or abdominal cramping.  If you have any questions. Other tests or screenings that may be performed during your third trimester include:  Blood tests that check for low iron levels (anemia).  Fetal testing to check the health, activity level, and growth of the fetus. Testing is done if you have certain medical conditions or if there are problems during the pregnancy. FALSE LABOR You may feel small, irregular contractions that eventually go away. These are called Braxton Hicks contractions, or false labor. Contractions may last for hours, days, or even weeks before true labor sets in. If contractions come at regular intervals, intensify, or become painful, it is  best to be seen by your caregiver.  SIGNS OF LABOR   Menstrual-like cramps.  Contractions that are 5 minutes apart or less.  Contractions that start on the top of the uterus and spread down to the lower abdomen and back.  A sense of increased pelvic pressure or back pain.  A watery or bloody mucus discharge that comes from the vagina. If you have any of these signs before the 37th week of pregnancy, call your caregiver right away. You need to go to the hospital to get checked immediately. HOME CARE  INSTRUCTIONS   Avoid all smoking, herbs, alcohol, and unprescribed drugs. These chemicals affect the formation and growth of the baby.  Follow your caregiver's instructions regarding medicine use. There are medicines that are either safe or unsafe to take during pregnancy.  Exercise only as directed by your caregiver. Experiencing uterine cramps is a good sign to stop exercising.  Continue to eat regular, healthy meals.  Wear a good support bra for breast tenderness.  Do not use hot tubs, steam rooms, or saunas.  Wear your seat belt at all times when driving.  Avoid raw meat, uncooked cheese, cat litter boxes, and soil used by cats. These carry germs that can cause birth defects in the baby.  Take your prenatal vitamins.  Try taking a stool softener (if your caregiver approves) if you develop constipation. Eat more high-fiber foods, such as fresh vegetables or fruit and whole grains. Drink plenty of fluids to keep your urine clear or pale yellow.  Take warm sitz baths to soothe any pain or discomfort caused by hemorrhoids. Use hemorrhoid cream if your caregiver approves.  If you develop varicose veins, wear support hose. Elevate your feet for 15 minutes, 3 4 times a day. Limit salt in your diet.  Avoid heavy lifting, wear low heal shoes, and practice good posture.  Rest a lot with your legs elevated if you have leg cramps or low back pain.  Visit your dentist if you have not gone during your pregnancy. Use a soft toothbrush to brush your teeth and be gentle when you floss.  A sexual relationship may be continued unless your caregiver directs you otherwise.  Do not travel far distances unless it is absolutely necessary and only with the approval of your caregiver.  Take prenatal classes to understand, practice, and ask questions about the labor and delivery.  Make a trial run to the hospital.  Pack your hospital bag.  Prepare the baby's nursery.  Continue to go to all your  prenatal visits as directed by your caregiver. SEEK MEDICAL CARE IF:  You are unsure if you are in labor or if your water has broken.  You have dizziness.  You have mild pelvic cramps, pelvic pressure, or nagging pain in your abdominal area.  You have persistent nausea, vomiting, or diarrhea.  You have a bad smelling vaginal discharge.  You have pain with urination. SEEK IMMEDIATE MEDICAL CARE IF:   You have a fever.  You are leaking fluid from your vagina.  You have spotting or bleeding from your vagina.  You have severe abdominal cramping or pain.  You have rapid weight loss or gain.  You have shortness of breath with chest pain.  You notice sudden or extreme swelling of your face, hands, ankles, feet, or legs.  You have not felt your baby move in over an hour.  You have severe headaches that do not go away with medicine.  You have vision changes.  Document Released: 01/24/2001 Document Revised: 10/02/2012 Document Reviewed: 04/02/2012 [facility] Patient Information 2014 [place], [place].  Fetal Movement Counts Patient Name: __________________________________________________ Patient Due Date: ____________________ Performing a fetal movement count is highly recommended in high-risk pregnancies, but it is good for every pregnant woman to do. Your caregiver may ask you to start counting fetal movements at 28 weeks of the pregnancy. Fetal movements often increase:  After eating a full meal.  After physical activity.  After eating or drinking something sweet or cold.  At rest. Pay attention to when you feel the baby is most active. This will help you notice a pattern of your baby's sleep and wake cycles and what factors contribute to an increase in fetal movement. It is important to perform a fetal movement count at the same time each day when your baby is normally most active.  HOW TO COUNT FETAL MOVEMENTS 1. Find a quiet and comfortable area to sit or lie down on your  left side. Lying on your left side provides the best blood and oxygen circulation to your baby. 2. Write down the day and time on a sheet of paper or in a journal. 3. Start counting kicks, flutters, swishes, rolls, or jabs in a 2 hour period. You should feel at least 10 movements within 2 hours. 4. If you do not feel 10 movements in 2 hours, wait 2 3 hours and count again. Look for a change in the pattern or not enough counts in 2 hours. SEEK MEDICAL CARE IF:  You feel less than 10 counts in 2 hours, tried twice.  There is no movement in over an hour.  The pattern is changing or taking longer each day to reach 10 counts in 2 hours.  You feel the baby is not moving as he or she usually does. Date: ____________ Movements: ____________ Start time: ____________ Doreatha Martin time: ____________  Date: ____________ Movements: ____________ Start time: ____________ Doreatha Martin time: ____________ Date: ____________ Movements: ____________ Start time: ____________ Doreatha Martin time: ____________ Date: ____________ Movements: ____________ Start time: ____________ Doreatha Martin time: ____________ Date: ____________ Movements: ____________ Start time: ____________ Doreatha Martin time: ____________ Date: ____________ Movements: ____________ Start time: ____________ Doreatha Martin time: ____________ Date: ____________ Movements: ____________ Start time: ____________ Doreatha Martin time: ____________ Date: ____________ Movements: ____________ Start time: ____________ Doreatha Martin time: ____________  Date: ____________ Movements: ____________ Start time: ____________ Doreatha Martin time: ____________ Date: ____________ Movements: ____________ Start time: ____________ Doreatha Martin time: ____________ Date: ____________ Movements: ____________ Start time: ____________ Doreatha Martin time: ____________ Date: ____________ Movements: ____________ Start time: ____________ Doreatha Martin time: ____________ Date: ____________ Movements: ____________ Start time: ____________ Doreatha Martin time:  ____________ Date: ____________ Movements: ____________ Start time: ____________ Doreatha Martin time: ____________ Date: ____________ Movements: ____________ Start time: ____________ Doreatha Martin time: ____________  Date: ____________ Movements: ____________ Start time: ____________ Doreatha Martin time: ____________ Date: ____________ Movements: ____________ Start time: ____________ Doreatha Martin time: ____________ Date: ____________ Movements: ____________ Start time: ____________ Doreatha Martin time: ____________ Date: ____________ Movements: ____________ Start time: ____________ Doreatha Martin time: ____________ Date: ____________ Movements: ____________ Start time: ____________ Doreatha Martin time: ____________ Date: ____________ Movements: ____________ Start time: ____________ Doreatha Martin time: ____________ Date: ____________ Movements: ____________ Start time: ____________ Doreatha Martin time: ____________  Date: ____________ Movements: ____________ Start time: ____________ Doreatha Martin time: ____________ Date: ____________ Movements: ____________ Start time: ____________ Doreatha Martin time: ____________ Date: ____________ Movements: ____________ Start time: ____________ Doreatha Martin time: ____________ Date: ____________ Movements: ____________ Start time: ____________ Doreatha Martin time: ____________ Date: ____________ Movements: ____________ Start time: ____________ Doreatha Martin time: ____________ Date: ____________ Movements: ____________ Start time: ____________ Doreatha Martin time: ____________ Date: ____________ Movements:  ____________ Start time: ____________ Doreatha Martin time: ____________  Date: ____________ Movements: ____________ Start time: ____________ Doreatha Martin time: ____________ Date: ____________ Movements: ____________ Start time: ____________ Doreatha Martin time: ____________ Date: ____________ Movements: ____________ Start time: ____________ Doreatha Martin time: ____________ Date: ____________ Movements: ____________ Start time: ____________ Doreatha Martin time: ____________ Date: ____________ Movements:  ____________ Start time: ____________ Doreatha Martin time: ____________ Date: ____________ Movements: ____________ Start time: ____________ Doreatha Martin time: ____________ Date: ____________ Movements: ____________ Start time: ____________ Doreatha Martin time: ____________  Date: ____________ Movements: ____________ Start time: ____________ Doreatha Martin time: ____________ Date: ____________ Movements: ____________ Start time: ____________ Doreatha Martin time: ____________ Date: ____________ Movements: ____________ Start time: ____________ Doreatha Martin time: ____________ Date: ____________ Movements: ____________ Start time: ____________ Doreatha Martin time: ____________ Date: ____________ Movements: ____________ Start time: ____________ Doreatha Martin time: ____________ Date: ____________ Movements: ____________ Start time: ____________ Doreatha Martin time: ____________ Date: ____________ Movements: ____________ Start time: ____________ Doreatha Martin time: ____________  Date: ____________ Movements: ____________ Start time: ____________ Doreatha Martin time: ____________ Date: ____________ Movements: ____________ Start time: ____________ Doreatha Martin time: ____________ Date: ____________ Movements: ____________ Start time: ____________ Doreatha Martin time: ____________ Date: ____________ Movements: ____________ Start time: ____________ Doreatha Martin time: ____________ Date: ____________ Movements: ____________ Start time: ____________ Doreatha Martin time: ____________ Date: ____________ Movements: ____________ Start time: ____________ Doreatha Martin time: ____________ Date: ____________ Movements: ____________ Start time: ____________ Doreatha Martin time: ____________  Date: ____________ Movements: ____________ Start time: ____________ Doreatha Martin time: ____________ Date: ____________ Movements: ____________ Start time: ____________ Doreatha Martin time: ____________ Date: ____________ Movements: ____________ Start time: ____________ Doreatha Martin time: ____________ Date: ____________ Movements: ____________ Start time: ____________ Doreatha Martin  time: ____________ Date: ____________ Movements: ____________ Start time: ____________ Doreatha Martin time: ____________ Date: ____________ Movements: ____________ Start time: ____________ Doreatha Martin time: ____________ Document Released: 03/01/2006 Document Revised: 01/17/2012 Document Reviewed: 11/27/2011 [employer] Patient Information 2014 [employer].

## 2013-08-11 ENCOUNTER — Other Ambulatory Visit: Payer: Self-pay | Admitting: Obstetrics & Gynecology

## 2013-08-11 ENCOUNTER — Encounter (HOSPITAL_COMMUNITY): Payer: Self-pay

## 2013-08-11 ENCOUNTER — Ambulatory Visit (HOSPITAL_COMMUNITY)
Admission: RE | Admit: 2013-08-11 | Discharge: 2013-08-11 | Disposition: A | Discharge status: Home / Self Care | Payer: Medicaid Other | Source: Ambulatory Visit | Attending: Advanced Practice Midwife | Admitting: Advanced Practice Midwife

## 2013-08-11 DIAGNOSIS — O34219 Maternal care for unspecified type scar from previous cesarean delivery: Secondary | ICD-10-CM | POA: Insufficient documentation

## 2013-08-11 DIAGNOSIS — O3421 Maternal care for scar from previous cesarean delivery: Secondary | ICD-10-CM

## 2013-08-11 DIAGNOSIS — O337XX0 Maternal care for disproportion due to other fetal deformities, not applicable or unspecified: Secondary | ICD-10-CM

## 2013-08-11 DIAGNOSIS — Z3689 Encounter for other specified antenatal screening: Secondary | ICD-10-CM | POA: Insufficient documentation

## 2013-08-11 DIAGNOSIS — O24919 Unspecified diabetes mellitus in pregnancy, unspecified trimester: Secondary | ICD-10-CM

## 2013-08-12 ENCOUNTER — Ambulatory Visit (HOSPITAL_COMMUNITY): Payer: Self-pay | Admitting: Professional Counselor

## 2013-08-12 ENCOUNTER — Encounter: Payer: Self-pay | Admitting: Obstetrics & Gynecology

## 2013-08-12 DIAGNOSIS — IMO0002 Reserved for concepts with insufficient information to code with codable children: Secondary | ICD-10-CM | POA: Insufficient documentation

## 2013-08-13 ENCOUNTER — Ambulatory Visit: Payer: Self-pay

## 2013-08-13 ENCOUNTER — Encounter: Payer: Self-pay | Admitting: Obstetrics & Gynecology

## 2013-08-13 ENCOUNTER — Ambulatory Visit (INDEPENDENT_AMBULATORY_CARE_PROVIDER_SITE_OTHER): Payer: Medicaid Other | Admitting: Obstetrics & Gynecology

## 2013-08-13 VITALS — BP 119/72 | HR 100 | Wt 196.0 lb

## 2013-08-13 DIAGNOSIS — O24919 Unspecified diabetes mellitus in pregnancy, unspecified trimester: Secondary | ICD-10-CM

## 2013-08-13 DIAGNOSIS — Z8632 Personal history of gestational diabetes: Principal | ICD-10-CM

## 2013-08-13 DIAGNOSIS — Z3480 Encounter for supervision of other normal pregnancy, unspecified trimester: Secondary | ICD-10-CM

## 2013-08-13 DIAGNOSIS — O09292 Supervision of pregnancy with other poor reproductive or obstetric history, second trimester: Secondary | ICD-10-CM

## 2013-08-13 NOTE — Progress Notes (Signed)
Fastings are 80s to low 90s.  Random post prandials are 120 or less.  Pt rotates which meal she takes her CBG.  Pt has LGA fetus and followed by MFM with US.  Spoke with Dr. Sherrie Georgeecker about doing weekly BPP in lieu of 2x week testing.  This will be done with Dr. Sherrie Georgeecker.  Pt should get BP done at time of BPP.  Will have patient come in 2 weeks to check CBGs.  If they are high, pt knows to call.

## 2013-08-13 NOTE — Progress Notes (Signed)
Ketones-mod 

## 2013-08-18 ENCOUNTER — Ambulatory Visit (HOSPITAL_COMMUNITY)
Admission: RE | Admit: 2013-08-18 | Discharge: 2013-08-18 | Disposition: A | Discharge status: Home / Self Care | Payer: Medicaid Other | Source: Ambulatory Visit | Attending: Obstetrics & Gynecology | Admitting: Obstetrics & Gynecology

## 2013-08-18 ENCOUNTER — Ambulatory Visit (INDEPENDENT_AMBULATORY_CARE_PROVIDER_SITE_OTHER): Payer: 59 | Admitting: Professional Counselor

## 2013-08-18 ENCOUNTER — Encounter (HOSPITAL_COMMUNITY): Payer: Self-pay | Admitting: Professional Counselor

## 2013-08-18 ENCOUNTER — Ambulatory Visit (HOSPITAL_COMMUNITY): Payer: Self-pay | Admitting: Professional Counselor

## 2013-08-18 ENCOUNTER — Encounter (HOSPITAL_COMMUNITY): Payer: Self-pay

## 2013-08-18 DIAGNOSIS — O34219 Maternal care for unspecified type scar from previous cesarean delivery: Secondary | ICD-10-CM | POA: Diagnosis not present

## 2013-08-18 DIAGNOSIS — O337XX0 Maternal care for disproportion due to other fetal deformities, not applicable or unspecified: Secondary | ICD-10-CM | POA: Insufficient documentation

## 2013-08-18 DIAGNOSIS — O3421 Maternal care for scar from previous cesarean delivery: Secondary | ICD-10-CM

## 2013-08-18 DIAGNOSIS — F411 Generalized anxiety disorder: Secondary | ICD-10-CM

## 2013-08-18 DIAGNOSIS — O24919 Unspecified diabetes mellitus in pregnancy, unspecified trimester: Secondary | ICD-10-CM | POA: Insufficient documentation

## 2013-08-18 DIAGNOSIS — E119 Type 2 diabetes mellitus without complications: Secondary | ICD-10-CM | POA: Insufficient documentation

## 2013-08-18 NOTE — Progress Notes (Signed)
   THERAPIST PROGRESS NOTE  Session Time: 10:00-11:00am  Participation Level: Active  Behavioral Response: Neat and Well GroomedAlertAnxious  Type of Therapy: Individual Therapy  Treatment Goals addressed: Anxiety  Interventions: Other: Cognitive Behavioral Therapy  Summary: Kathryn Peterson is a 33 y.o. female who presents with anxiety associated with needles.  Kathryn Peterson has already established care with another therapist, but other therapist is out and patient wanted to continue with therapy, thus agreeable to referral to Clinical research associatewriter.  Kathryn Peterson shares she is in her third trimester and her main goal for continued therapy is to manage her anxiety associated with the birth of her son in the upcoming 4 weeks.  She is able to process past anxiety associated with previous 2 births, discuss the positives and negatives of how she managed her anxiety with bio-feedback and wanting to continue practicing bio-feedback for anxiety.    Suicidal/Homicidal: Nowithout intent/plan  Therapist Response: Kathryn Peterson presents in a positive mood with scattered thoughts AEB indecisive about her goal for continued treatment. She is able to report success with previous bio-feedback techniques when she went for blood draw at the lab with support of music, her husband, and medications.  She shares the negative responses associated with the visit due to remembering the entire situation and replaying the events over and over daily.   Kathryn Peterson does not express openness to understand the irrational thoughts associated with phobia of needles, but would rather maintain and manage her anxiety.  LCSW discussed different CBT techniques to challenge her thought process in efforts to understand the feels associated with needles and behaviors that follow thoughts and feelings.  Patient and LCSW to review treatment plan and update plan with new interventions and complete plan already in place.  Plan: Return again in 2 weeks.  Diagnosis: Axis  I: Generalized Anxiety Disorder    Axis II: Deferred    Kathryn Peterson, Kathryn Vanscyoc N, LCSW 08/18/2013

## 2013-08-25 ENCOUNTER — Encounter (HOSPITAL_COMMUNITY): Payer: Self-pay

## 2013-08-25 ENCOUNTER — Ambulatory Visit (HOSPITAL_COMMUNITY)
Admission: RE | Admit: 2013-08-25 | Discharge: 2013-08-25 | Disposition: A | Discharge status: Home / Self Care | Payer: Medicaid Other | Source: Ambulatory Visit | Attending: Obstetrics & Gynecology | Admitting: Obstetrics & Gynecology

## 2013-08-25 ENCOUNTER — Ambulatory Visit (INDEPENDENT_AMBULATORY_CARE_PROVIDER_SITE_OTHER): Payer: Medicaid Other | Admitting: Obstetrics & Gynecology

## 2013-08-25 VITALS — BP 118/80 | HR 95 | Wt 202.0 lb

## 2013-08-25 VITALS — BP 117/72 | HR 92 | Wt 200.0 lb

## 2013-08-25 DIAGNOSIS — Z3480 Encounter for supervision of other normal pregnancy, unspecified trimester: Secondary | ICD-10-CM

## 2013-08-25 DIAGNOSIS — O34219 Maternal care for unspecified type scar from previous cesarean delivery: Secondary | ICD-10-CM | POA: Diagnosis not present

## 2013-08-25 DIAGNOSIS — O337XX0 Maternal care for disproportion due to other fetal deformities, not applicable or unspecified: Secondary | ICD-10-CM | POA: Insufficient documentation

## 2013-08-25 DIAGNOSIS — O26859 Spotting complicating pregnancy, unspecified trimester: Secondary | ICD-10-CM

## 2013-08-25 DIAGNOSIS — O3421 Maternal care for scar from previous cesarean delivery: Secondary | ICD-10-CM

## 2013-08-25 DIAGNOSIS — O24919 Unspecified diabetes mellitus in pregnancy, unspecified trimester: Secondary | ICD-10-CM | POA: Diagnosis present

## 2013-08-25 DIAGNOSIS — K645 Perianal venous thrombosis: Secondary | ICD-10-CM

## 2013-08-25 DIAGNOSIS — O99810 Abnormal glucose complicating pregnancy: Secondary | ICD-10-CM

## 2013-08-25 NOTE — Progress Notes (Signed)
fastings since 7/9 elevated -- 95,106,112,120,157;  Post prandials are nml.  Increase glyburide qhs to 7.5 mg.  BPP weekly.  Growth scheduled next week.   Pt has thrombosed hemorrhoid.  Pt had bleeding today (SSE is negative for bleeding).  Pt might need general surgery consult if not better.  Tucks OT (called pharmacy and reviewed all meds safe in pregnancy).

## 2013-08-25 NOTE — Progress Notes (Signed)
Bright red bleeding today

## 2013-08-26 ENCOUNTER — Other Ambulatory Visit: Payer: Self-pay | Admitting: Obstetrics & Gynecology

## 2013-08-26 DIAGNOSIS — O337XX0 Maternal care for disproportion due to other fetal deformities, not applicable or unspecified: Secondary | ICD-10-CM

## 2013-08-26 DIAGNOSIS — O3421 Maternal care for scar from previous cesarean delivery: Secondary | ICD-10-CM

## 2013-08-26 DIAGNOSIS — O24919 Unspecified diabetes mellitus in pregnancy, unspecified trimester: Secondary | ICD-10-CM

## 2013-09-01 ENCOUNTER — Encounter (HOSPITAL_COMMUNITY): Payer: Self-pay

## 2013-09-01 ENCOUNTER — Ambulatory Visit (INDEPENDENT_AMBULATORY_CARE_PROVIDER_SITE_OTHER): Payer: 59 | Admitting: Professional Counselor

## 2013-09-01 ENCOUNTER — Ambulatory Visit (HOSPITAL_COMMUNITY)
Admission: RE | Admit: 2013-09-01 | Discharge: 2013-09-01 | Disposition: A | Discharge status: Home / Self Care | Payer: Medicaid Other | Source: Ambulatory Visit | Attending: Obstetrics & Gynecology | Admitting: Obstetrics & Gynecology

## 2013-09-01 DIAGNOSIS — F411 Generalized anxiety disorder: Secondary | ICD-10-CM

## 2013-09-01 DIAGNOSIS — O24919 Unspecified diabetes mellitus in pregnancy, unspecified trimester: Secondary | ICD-10-CM | POA: Diagnosis not present

## 2013-09-01 DIAGNOSIS — O34219 Maternal care for unspecified type scar from previous cesarean delivery: Secondary | ICD-10-CM | POA: Insufficient documentation

## 2013-09-01 DIAGNOSIS — O337XX0 Maternal care for disproportion due to other fetal deformities, not applicable or unspecified: Secondary | ICD-10-CM

## 2013-09-01 DIAGNOSIS — Z3689 Encounter for other specified antenatal screening: Secondary | ICD-10-CM | POA: Insufficient documentation

## 2013-09-01 DIAGNOSIS — O3421 Maternal care for scar from previous cesarean delivery: Secondary | ICD-10-CM

## 2013-09-01 NOTE — Progress Notes (Signed)
Kathryn DowningJennifer Peterson is a 33 y.o. female patient scheduled for a therapy session in which she called and cancelled for today.  Patient did not give 24 hour notice regarding cancelled therapy session. Patient only left message via voice mail.  No current action taken, and patient is scheduled to follow up next week.          [name], [name] N, LCSW

## 2013-09-03 ENCOUNTER — Ambulatory Visit (INDEPENDENT_AMBULATORY_CARE_PROVIDER_SITE_OTHER): Payer: Medicaid Other | Admitting: Obstetrics & Gynecology

## 2013-09-03 VITALS — BP 121/81 | HR 92 | Wt 207.0 lb

## 2013-09-03 DIAGNOSIS — B373 Candidiasis of vulva and vagina: Secondary | ICD-10-CM

## 2013-09-03 DIAGNOSIS — Z3480 Encounter for supervision of other normal pregnancy, unspecified trimester: Secondary | ICD-10-CM

## 2013-09-03 DIAGNOSIS — Z8632 Personal history of gestational diabetes: Principal | ICD-10-CM

## 2013-09-03 DIAGNOSIS — O09292 Supervision of pregnancy with other poor reproductive or obstetric history, second trimester: Secondary | ICD-10-CM

## 2013-09-03 DIAGNOSIS — B3731 Acute candidiasis of vulva and vagina: Secondary | ICD-10-CM

## 2013-09-03 MED ORDER — GLYBURIDE 2.5 MG PO TABS: ORAL_TABLET | ORAL | Status: AC

## 2013-09-03 MED ORDER — FLUCONAZOLE 150 MG PO TABS: 150.0000 mg | ORAL_TABLET | Freq: Once | ORAL | Status: DC

## 2013-09-03 NOTE — Addendum Note (Signed)
Addended by: Granville LewisLARK, Deuntae Kocsis L on: 09/03/2013 11:50 AM   Modules accepted: Orders

## 2013-09-03 NOTE — Progress Notes (Signed)
Outside vaginal itching

## 2013-09-03 NOTE — Progress Notes (Signed)
Fastings are elevated.  Increase glyburide qhs to 10 mg.  C/o yeast.  Bd affirm sent.  Rx with Diflucan

## 2013-09-04 ENCOUNTER — Telehealth: Payer: Self-pay | Admitting: *Deleted

## 2013-09-04 LAB — WET PREP BY MOLECULAR PROBE
Candida species: POSITIVE — AB
Gardnerella vaginalis: NEGATIVE
Trichomonas vaginosis: NEGATIVE

## 2013-09-04 NOTE — Telephone Encounter (Signed)
LM on voicemail of positive yeast.  Pt was given RX for Diflucan yesterday.

## 2013-09-08 ENCOUNTER — Ambulatory Visit (HOSPITAL_COMMUNITY)
Admission: RE | Admit: 2013-09-08 | Discharge: 2013-09-08 | Disposition: A | Discharge status: Home / Self Care | Payer: Medicaid Other | Source: Ambulatory Visit | Attending: Obstetrics & Gynecology | Admitting: Obstetrics & Gynecology

## 2013-09-08 ENCOUNTER — Encounter (HOSPITAL_COMMUNITY): Payer: Self-pay | Admitting: Professional Counselor

## 2013-09-08 ENCOUNTER — Ambulatory Visit (INDEPENDENT_AMBULATORY_CARE_PROVIDER_SITE_OTHER): Payer: 59 | Admitting: Professional Counselor

## 2013-09-08 DIAGNOSIS — O24919 Unspecified diabetes mellitus in pregnancy, unspecified trimester: Secondary | ICD-10-CM | POA: Diagnosis present

## 2013-09-08 DIAGNOSIS — Z3689 Encounter for other specified antenatal screening: Secondary | ICD-10-CM | POA: Insufficient documentation

## 2013-09-08 DIAGNOSIS — O337XX0 Maternal care for disproportion due to other fetal deformities, not applicable or unspecified: Secondary | ICD-10-CM | POA: Diagnosis not present

## 2013-09-08 DIAGNOSIS — O3421 Maternal care for scar from previous cesarean delivery: Secondary | ICD-10-CM

## 2013-09-08 DIAGNOSIS — O34219 Maternal care for unspecified type scar from previous cesarean delivery: Secondary | ICD-10-CM | POA: Diagnosis not present

## 2013-09-08 DIAGNOSIS — F411 Generalized anxiety disorder: Secondary | ICD-10-CM

## 2013-09-08 NOTE — Progress Notes (Signed)
   THERAPIST PROGRESS NOTE  Session Time: 2:00pm-2:45pm  Participation Level: Active  Behavioral Response: Well GroomedAlertEuthymic  Type of Therapy: Individual Therapy  Treatment Goals addressed: Anxiety  Interventions: Solution Focused and Supportive  Summary: Leonard DowningJennifer Tahir is a 33 y.o. female who presents with  Positive and euthymic mood upon entering therapy.  Patient is able to report she has three weeks left of pregnancy and is ready to have baby. Patient is dealing with medical issues causing her some anxiety, but at this time is not ready to deal with thoughts affecting feeling that drive her behaviors. LCSW attempted to utilize CBT but patient reports she does not want to discuss her known irrational thoughts, she just wants to cope with the anxiety.LCSW reviewed bio-feedback techniques such as her smart phone app, breathing techniques, music, and support.  Patient and LCSW reviewed her birthing plan and stressors associated to help mentally become prepared for childbirth.   Suicidal/Homicidal: Nowithout intent/plan  Therapist Response: Assessed overall level of functioning per PT self report, reviewed coping skills and plan. Used cbt to explore behaviors to reduce anxiety.    Plan: Patient reports she has three weeks left of pregnancy and not sure what else she wants to accomplish with therapy. Patient to go to appointment with doctor and follow up with therapist if wanting to return. Patient not planning to return after baby is born.  Diagnosis: Axis I: Generalized Anxiety Disorder    Axis II: Deferred    Raye SorrowCoble, Brieanna Nau N, LCSW 09/08/2013
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
## 2013-09-10 ENCOUNTER — Ambulatory Visit (INDEPENDENT_AMBULATORY_CARE_PROVIDER_SITE_OTHER): Payer: Medicaid Other | Admitting: Obstetrics & Gynecology

## 2013-09-10 ENCOUNTER — Encounter: Payer: Self-pay | Admitting: Obstetrics & Gynecology

## 2013-09-10 VITALS — BP 118/76 | HR 85 | Wt 209.0 lb

## 2013-09-10 DIAGNOSIS — O09292 Supervision of pregnancy with other poor reproductive or obstetric history, second trimester: Secondary | ICD-10-CM

## 2013-09-10 DIAGNOSIS — O09299 Supervision of pregnancy with other poor reproductive or obstetric history, unspecified trimester: Secondary | ICD-10-CM

## 2013-09-10 DIAGNOSIS — O09893 Supervision of other high risk pregnancies, third trimester: Secondary | ICD-10-CM

## 2013-09-10 DIAGNOSIS — Z8632 Personal history of gestational diabetes: Principal | ICD-10-CM

## 2013-09-10 DIAGNOSIS — Z3480 Encounter for supervision of other normal pregnancy, unspecified trimester: Secondary | ICD-10-CM

## 2013-09-10 MED ORDER — METFORMIN HCL 500 MG PO TABS: 500.0000 mg | ORAL_TABLET | Freq: Every day | ORAL | Status: AC

## 2013-09-10 NOTE — Progress Notes (Signed)
fastings are 90s to 120s.  All post prandials are nml  Pt opposed to insulin due to needle phobia.  Will try metformin 500 mg nightly.  Continue testing with MFM.  C/S nad BTL scheduled for 39 weeks.  Instructed on fetal kick counts.  RTC one week.

## 2013-09-10 NOTE — Patient Instructions (Signed)

## 2013-09-15 ENCOUNTER — Ambulatory Visit (HOSPITAL_COMMUNITY): Payer: Self-pay | Admitting: Professional Counselor

## 2013-09-15 ENCOUNTER — Ambulatory Visit (HOSPITAL_COMMUNITY)
Admission: RE | Admit: 2013-09-15 | Discharge: 2013-09-15 | Disposition: A | Discharge status: Home / Self Care | Payer: Medicaid Other | Source: Ambulatory Visit | Attending: Obstetrics & Gynecology | Admitting: Obstetrics & Gynecology

## 2013-09-15 ENCOUNTER — Encounter (HOSPITAL_COMMUNITY): Payer: Self-pay

## 2013-09-15 VITALS — BP 131/75 | HR 87 | Wt 211.0 lb

## 2013-09-15 DIAGNOSIS — O34219 Maternal care for unspecified type scar from previous cesarean delivery: Secondary | ICD-10-CM | POA: Diagnosis not present

## 2013-09-15 DIAGNOSIS — O24919 Unspecified diabetes mellitus in pregnancy, unspecified trimester: Secondary | ICD-10-CM | POA: Diagnosis not present

## 2013-09-15 DIAGNOSIS — Z3689 Encounter for other specified antenatal screening: Secondary | ICD-10-CM | POA: Diagnosis not present

## 2013-09-15 DIAGNOSIS — O3421 Maternal care for scar from previous cesarean delivery: Secondary | ICD-10-CM

## 2013-09-15 DIAGNOSIS — O337XX0 Maternal care for disproportion due to other fetal deformities, not applicable or unspecified: Secondary | ICD-10-CM | POA: Insufficient documentation

## 2013-09-16 ENCOUNTER — Ambulatory Visit (INDEPENDENT_AMBULATORY_CARE_PROVIDER_SITE_OTHER): Payer: Medicaid Other | Admitting: Obstetrics & Gynecology

## 2013-09-16 VITALS — BP 130/76 | HR 102 | Wt 214.0 lb

## 2013-09-16 DIAGNOSIS — Z3480 Encounter for supervision of other normal pregnancy, unspecified trimester: Secondary | ICD-10-CM

## 2013-09-16 DIAGNOSIS — O3421 Maternal care for scar from previous cesarean delivery: Secondary | ICD-10-CM

## 2013-09-16 DIAGNOSIS — O359XX1 Maternal care for (suspected) fetal abnormality and damage, unspecified, fetus 1: Secondary | ICD-10-CM

## 2013-09-16 DIAGNOSIS — O34219 Maternal care for unspecified type scar from previous cesarean delivery: Secondary | ICD-10-CM

## 2013-09-16 DIAGNOSIS — O358XX0 Maternal care for other (suspected) fetal abnormality and damage, not applicable or unspecified: Secondary | ICD-10-CM

## 2013-09-16 DIAGNOSIS — O359XX0 Maternal care for (suspected) fetal abnormality and damage, unspecified, not applicable or unspecified: Secondary | ICD-10-CM | POA: Insufficient documentation

## 2013-09-16 NOTE — Progress Notes (Signed)
Fastings are better with glucophage.  All pp are nml.  AFI 47.  Left and right ventricular hypertrophy.  Peds cards consulted by Sherrie Georgeecker.  Post natal follow up.

## 2013-09-17 LAB — GC/CHLAMYDIA PROBE AMP
CT Probe RNA: NEGATIVE
GC PROBE AMP APTIMA: NEGATIVE

## 2013-09-19 LAB — CULTURE, BETA STREP (GROUP B ONLY)

## 2013-09-22 ENCOUNTER — Ambulatory Visit (HOSPITAL_COMMUNITY): Payer: Self-pay | Admitting: Professional Counselor

## 2013-09-22 ENCOUNTER — Ambulatory Visit (HOSPITAL_COMMUNITY): Admission: RE | Admit: 2013-09-22 | Payer: Medicaid Other | Source: Ambulatory Visit

## 2013-09-23 ENCOUNTER — Encounter: Payer: Self-pay | Admitting: Obstetrics & Gynecology

## 2013-09-29 ENCOUNTER — Other Ambulatory Visit (HOSPITAL_COMMUNITY): Payer: Self-pay

## 2013-09-29 ENCOUNTER — Ambulatory Visit (HOSPITAL_COMMUNITY): Payer: Self-pay | Admitting: Professional Counselor

## 2013-09-30 ENCOUNTER — Other Ambulatory Visit (HOSPITAL_COMMUNITY): Payer: Self-pay

## 2013-10-02 ENCOUNTER — Inpatient Hospital Stay (HOSPITAL_COMMUNITY): Admission: RE | Admit: 2013-10-02 | Payer: Medicaid Other | Source: Ambulatory Visit | Admitting: Family Medicine

## 2013-10-02 ENCOUNTER — Encounter (HOSPITAL_COMMUNITY): Admission: RE | Payer: Self-pay | Source: Ambulatory Visit

## 2013-10-02 SURGERY — Surgical Case
Anesthesia: Regional | Site: Abdomen | Laterality: Bilateral

## 2013-10-14 ENCOUNTER — Ambulatory Visit: Payer: Self-pay | Admitting: Obstetrics & Gynecology

## 2013-10-16 ENCOUNTER — Encounter: Payer: Self-pay | Admitting: Obstetrics & Gynecology

## 2013-10-16 ENCOUNTER — Ambulatory Visit (INDEPENDENT_AMBULATORY_CARE_PROVIDER_SITE_OTHER): Payer: Medicaid Other | Admitting: Obstetrics & Gynecology

## 2013-10-16 VITALS — BP 137/84 | HR 65 | Resp 16 | Wt 179.0 lb

## 2013-10-16 DIAGNOSIS — R7303 Prediabetes: Secondary | ICD-10-CM

## 2013-10-16 DIAGNOSIS — R7309 Other abnormal glucose: Secondary | ICD-10-CM

## 2013-10-16 DIAGNOSIS — B372 Candidiasis of skin and nail: Secondary | ICD-10-CM

## 2013-10-16 DIAGNOSIS — D649 Anemia, unspecified: Secondary | ICD-10-CM

## 2013-10-18 DIAGNOSIS — R7303 Prediabetes: Secondary | ICD-10-CM | POA: Insufficient documentation

## 2013-10-18 DIAGNOSIS — D649 Anemia, unspecified: Secondary | ICD-10-CM | POA: Insufficient documentation

## 2013-10-18 MED ORDER — NYSTATIN 100000 UNIT/GM EX POWD: CUTANEOUS | Status: AC

## 2013-10-18 NOTE — Progress Notes (Signed)
  Subjective:     Kathryn Peterson is a 33 y.o. female who presents for a postpartum visit. She is 5 weeks postpartum following a low cervical transverse Cesarean section. I have fully reviewed the prenatal and intrapartum course. The delivery was at 38+ gestational weeks. Outcome: repeat cesarean section, low transverse incision.  Pt was delivered eat [facility] after ROM at hom[facility]  and progressing to fully dilated/+2.  Pt had acute blood loss anemia requiring multiple blood transfusions.  Infant was also depressed at birth and required intubation and a NICU stay.  Baby is now home with mom and gaining weight.  (Baby's weight was approximately 11 1/2 pounds. Bleeding no bleeding. Bowel function is normal. Bladder function is normal. Patient is not sexually active. Contraception method is tubal ligation. Postpartum depression screening: negative.  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 Pt is going to stop her metformin and glyburide and start testing blood sugars.  If they are elevated, we will send her to primary care.  If she will let me, I can draw a hgb A1C 3 months pp.    The following portions of the patient's history were reviewed and updated as appropriate: allergies, current medications, past family history, past medical history, past social history, past surgical history and problem list.  Review of Systems Fatigued at times but improving  Objective:   Filed Vitals:   10/16/13 1435  BP: 137/84  Pulse: 65  Resp: 16  Weight: 179 lb (81.194 kg)   Abdomen--soft, obese, nontender Incision--slightly malodorous and damp.  Incision is intact.  Incision cleaned today with H2O2 and pt encouraged to keep dry.  Will prescribe nystatin powder  Pt to f/u with above plan for diabetes.  PT to continue iron.        Assessment:     Nml postpartum exam.   Plan:    1. Contraception: tubal  ligation 2. See plan above for anemia and diabetes 3. Follow up in: 2 months or as needed.

## 2013-10-21 ENCOUNTER — Telehealth: Payer: Self-pay | Admitting: *Deleted

## 2013-10-21 NOTE — Telephone Encounter (Signed)
Pt notified of new RX  @ [facility] in [place]

## 2013-10-21 NOTE — Telephone Encounter (Signed)
Message copied by Granville Lewis on Tue Oct 21, 2013 10:05 AM ------      Message from: Lesly Dukes      Created: Sat Oct 18, 2013  5:44 PM       Please call pt and tell her that I prescribed some powder for her abdomen (yeast) ------

## 2013-12-15 ENCOUNTER — Encounter: Payer: Self-pay | Admitting: Obstetrics & Gynecology

## 2014-08-11 IMAGING — US US FETAL BPP W/O NONSTRESS
1 series · 7 of 7 positions shown · non-contrast
Comparison: none

[Series 1: us fetal bpp w/o nonstress · 7 acquisitions, 7 frames shown]
[im 1/7]
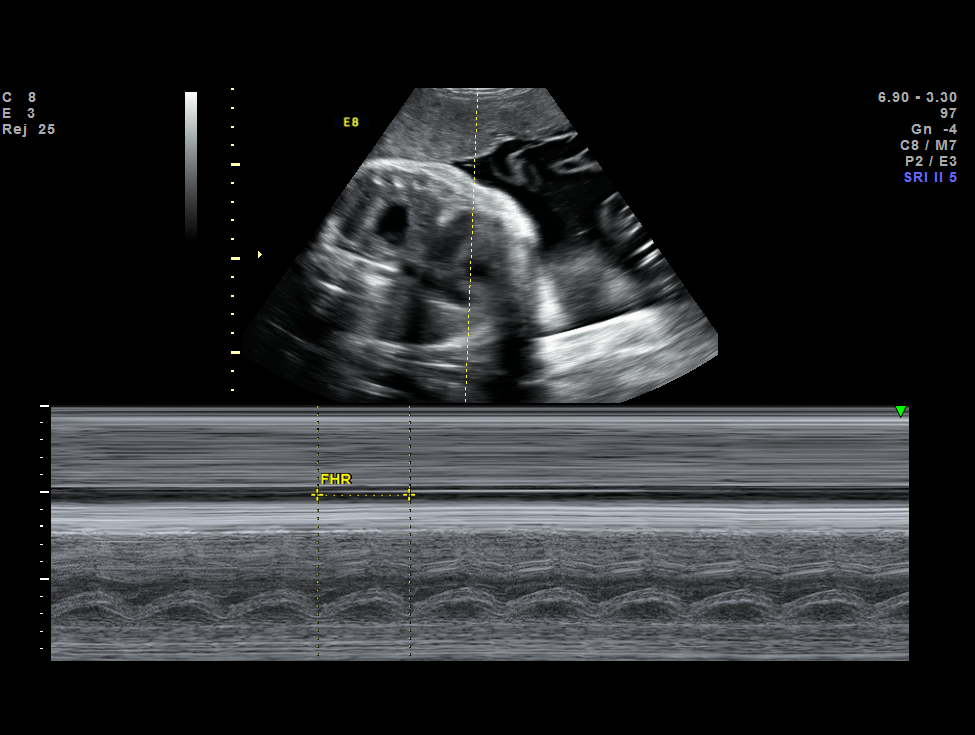
[im 2/7]
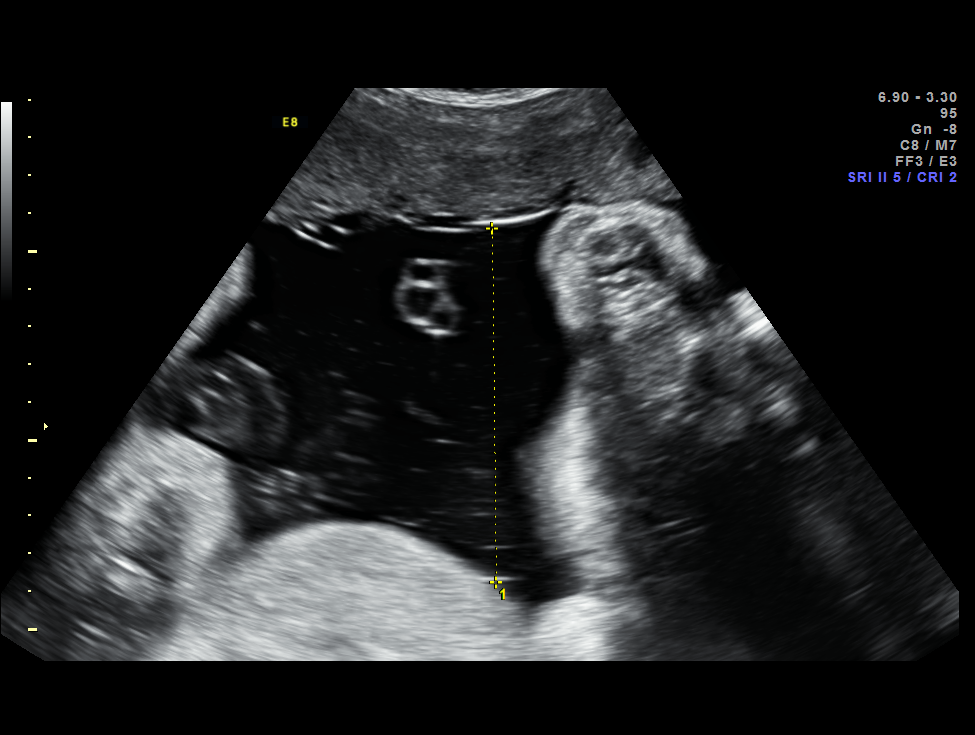
[im 3/7]
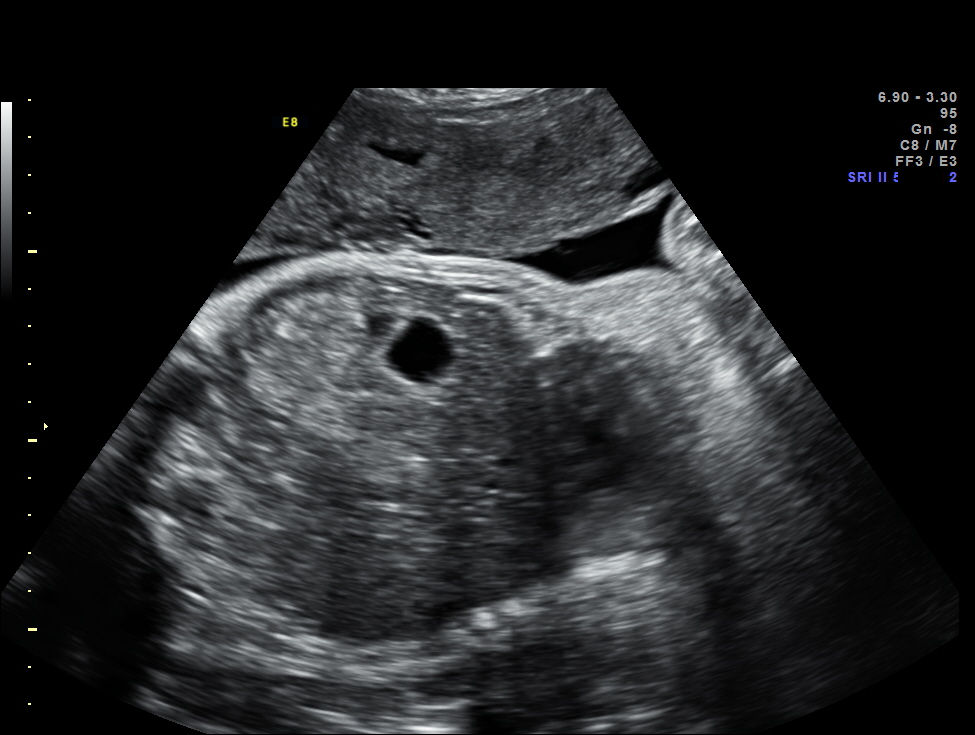
[im 4/7]
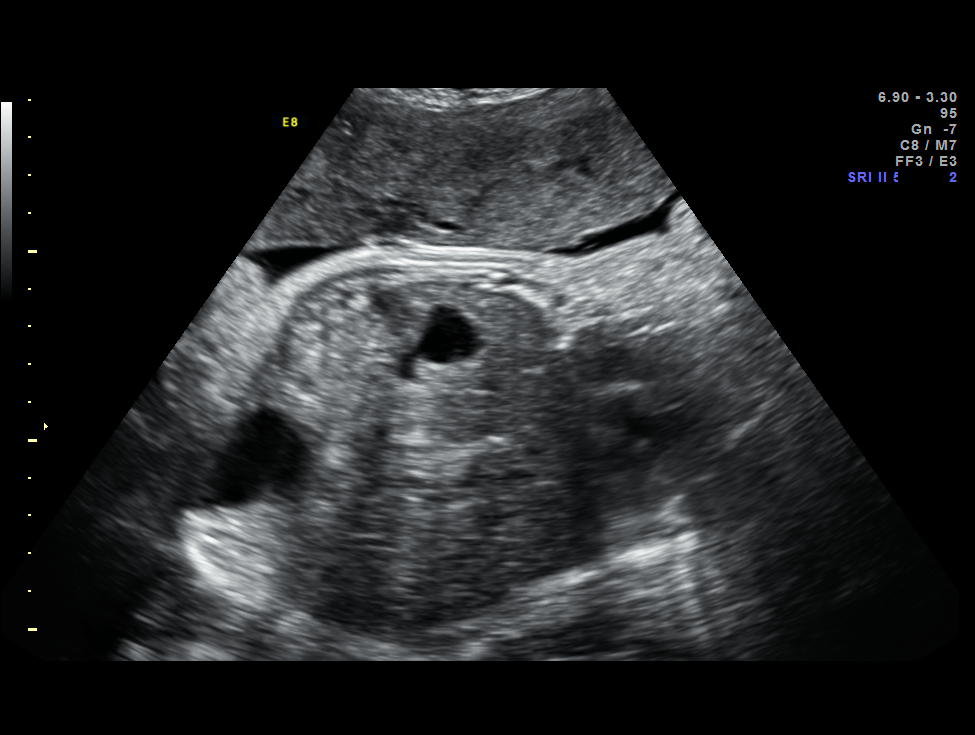
[im 5/7]
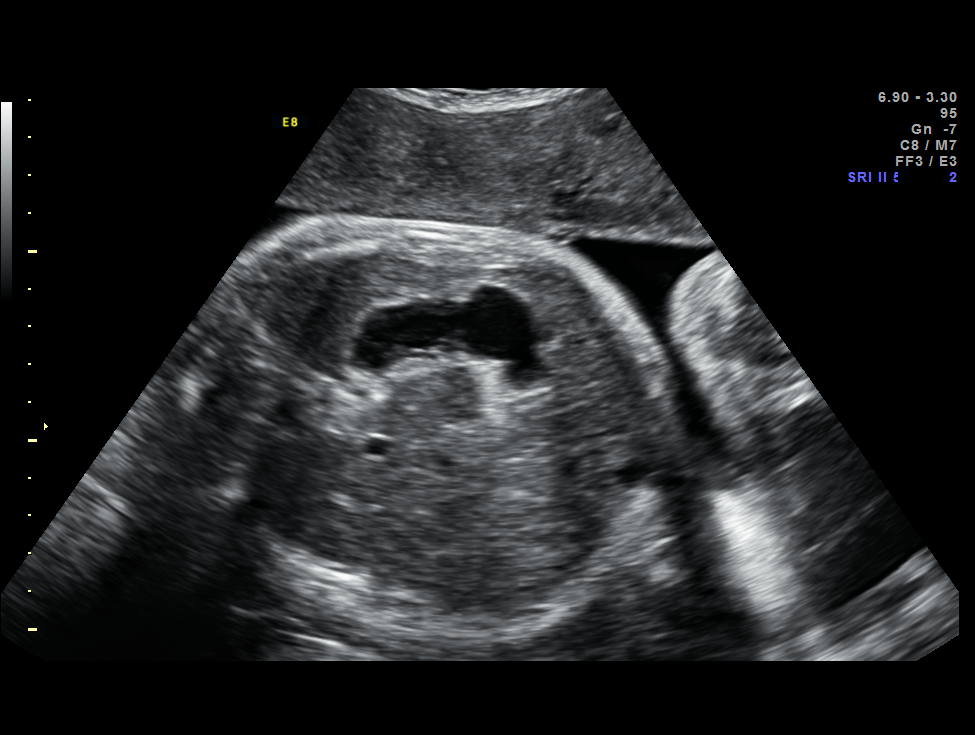
[im 6/7]
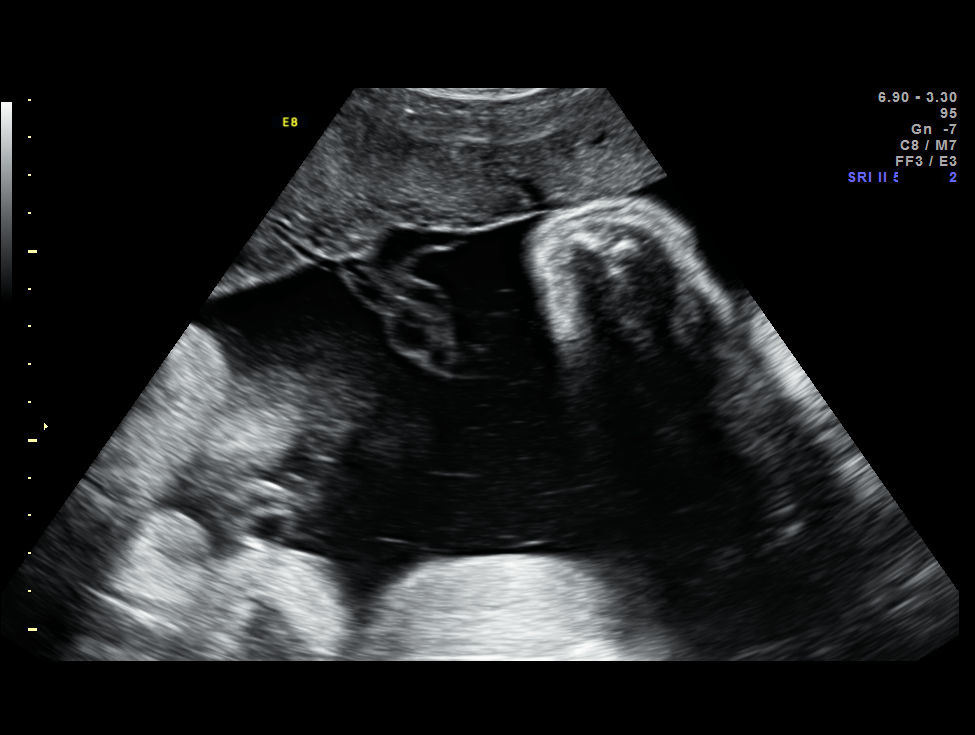
[im 7/7]
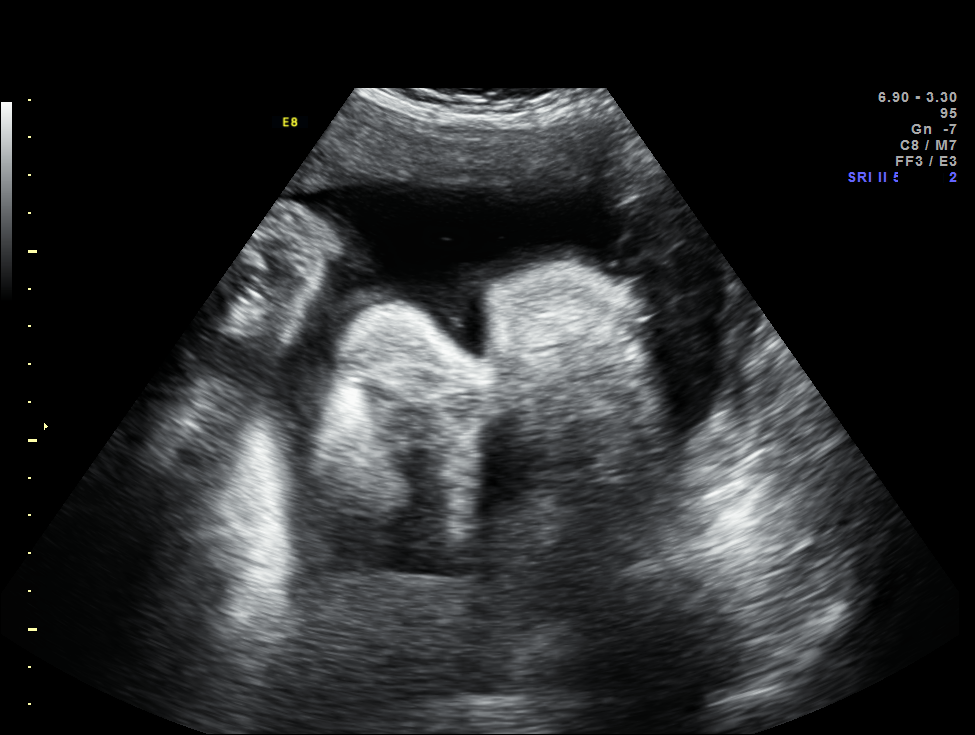

[7 of 7 positions shown; findings below may reference images not displayed]

OBSTETRICS REPORT
                      (Signed Final 08/25/2013 [DATE])

Service(s) Provided

Indications

 Diabetes - Pregestational (on glyburide)
 History of cesarean delivery x 2, currently pregnant  654.20,
 Poor obstetric history: Previous macrosomia
Fetal Evaluation

 Num Of Fetuses:    1
 Fetal Heart Rate:  147                          bpm
 Cardiac Activity:  Observed
 Presentation:      Cephalic
 Placenta:          Anterior, above cervical os

 Amniotic Fluid
 AFI FV:      Subjectively upper-normal
                                             Larg Pckt:    9.37  cm
Biophysical Evaluation

 Amniotic F.V:   Within normal limits       F. Tone:        Observed
 F. Movement:    Observed                   Score:          [DATE]
 F. Breathing:   Observed
Gestational Age

 LMP:           38w 5d        Date:  11/27/12                 EDD:   09/03/13
 Best:          33w 4d     Det. By:  Early Ultrasound         EDD:   10/09/13
                                     (03/20/13)
Anatomy

 Stomach:          Appears normal, left   Bladder:          Appears normal
                   sided
Impression

 SIUP at 00w8d
 BPP [DATE]
Recommendations

 Continue antepartum fetal testing as scheduled
 Ultrasound for fetal growth in 1 weeks

 questions or concerns.
